# Patient Record
Sex: Female | Born: 1945 | Hispanic: Yes | Smoking: Never smoker
Health system: Southern US, Community
[De-identification: ages and names within clinical notes are randomized; demographics above are authoritative.]

## PROBLEM LIST (undated history)

## (undated) DIAGNOSIS — E119 Type 2 diabetes mellitus without complications: Secondary | ICD-10-CM

## (undated) DIAGNOSIS — I10 Essential (primary) hypertension: Secondary | ICD-10-CM

## (undated) DIAGNOSIS — E7800 Pure hypercholesterolemia, unspecified: Secondary | ICD-10-CM

## (undated) HISTORY — PX: ABDOMINAL HYSTERECTOMY: SHX81

## (undated) HISTORY — PX: CHOLECYSTECTOMY: SHX55

## (undated) HISTORY — DX: Essential (primary) hypertension: I10

## (undated) HISTORY — DX: Type 2 diabetes mellitus without complications: E11.9

---

## 2006-04-29 ENCOUNTER — Emergency Department (HOSPITAL_COMMUNITY): Admission: EM | Admit: 2006-04-29 | Discharge: 2006-04-29 | Payer: Self-pay | Admitting: Family Medicine

## 2010-01-22 ENCOUNTER — Emergency Department (HOSPITAL_COMMUNITY): Admission: EM | Admit: 2010-01-22 | Discharge: 2010-01-22 | Payer: Self-pay | Admitting: Family Medicine

## 2014-12-29 ENCOUNTER — Other Ambulatory Visit: Payer: Self-pay | Admitting: Family Medicine

## 2014-12-29 ENCOUNTER — Ambulatory Visit (INDEPENDENT_AMBULATORY_CARE_PROVIDER_SITE_OTHER): Payer: Self-pay | Admitting: Family Medicine

## 2014-12-29 VITALS — BP 134/78 | HR 81 | Temp 98.6°F | Resp 18 | Ht 60.0 in | Wt 149.0 lb

## 2014-12-29 DIAGNOSIS — M5432 Sciatica, left side: Secondary | ICD-10-CM

## 2014-12-29 DIAGNOSIS — H109 Unspecified conjunctivitis: Secondary | ICD-10-CM

## 2014-12-29 MED ORDER — TOBRAMYCIN 0.3 % OP SOLN: 1.0000 [drp] | OPHTHALMIC | Status: DC

## 2014-12-29 MED ORDER — PREDNISONE 20 MG PO TABS: ORAL_TABLET | ORAL | Status: DC

## 2014-12-29 NOTE — Patient Instructions (Signed)
If not better in 2 days, please let me know so we can try different medication.  If the high stay red, consider calling Dr. Ernesto Rutherford, an eye doctor who can check the pressure in the eye

## 2014-12-29 NOTE — Progress Notes (Signed)
Patient ID: Sara Harper, female   DOB: [birth year], 69 y.o.   MRN: [id number]   This chart was scribed for Elvina Sidle, MD by [facility], medical scribe at [facility].The patient was seen in exam room 13 and the patient's care was started at 4:56 PM.  Patient ID: Sara Harper MRN: [id number], DOB: 1946/05/10, 69 y.o. Date of Encounter: 12/29/2014  Primary Physician: No PCP Per Patient  Chief Complaint:  Chief Complaint  Patient presents with   sore on foot    left x2 yrs   Foot Pain    left    Ankle Pain    left   HPI:  Sara Harper is a 69 y.o. female with a history of diabetes any hypertension who presents to [facility] complaining of lower back pain that radiates down her left leg. This pain has been ongoing for two years. Pain is worse when she walks. She also complains of bilateral eye pain, redness, and irritation , she was seen by a doctor in [place] but she was not prescribed eye drops for this. She has taken OTC eye drops but these were not helpful. She denies abdominal pain, fevers and chills.   Past Medical History  Diagnosis Date   Diabetes mellitus without complication    Hypertension     Home Meds: Prior to Admission medications   Medication Sig Start Date End Date Taking? Authorizing Provider  enalapril (VASOTEC) 10 MG tablet Take 10 mg by mouth daily.   Yes Historical Provider, MD  metFORMIN (GLUCOPHAGE) 850 MG tablet Take 850 mg by mouth daily with breakfast.   Yes Historical Provider, MD   Allergies: No Known Allergies  History   Social History   Marital Status: Divorced    Spouse Name: N/A   Number of Children: N/A   Years of Education: N/A   Occupational History   Not on file.   Social History Main Topics   Smoking status: Never Smoker    Smokeless tobacco: Not on file   Alcohol Use: No   Drug Use: No   Sexual Activity: Not on file   Other Topics Concern   Not on file   Social  History Narrative   No narrative on file    Review of Systems: Constitutional: negative for chills, fever, night sweats, weight changes, or fatigue  HEENT: negative for vision changes, hearing loss, congestion, rhinorrhea, ST, epistaxis, or sinus pressure. Positive for eye pain Cardiovascular: negative for chest pain or palpitations Respiratory: negative for hemoptysis, wheezing, shortness of breath, or cough Abdominal: negative for abdominal pain, nausea, vomiting, diarrhea, or constipation Dermatological: negative for rash. Msk:  Neurologic: negative for headache, dizziness, or syncope All other systems reviewed and are otherwise negative with the exception to those above and in the HPI.  Physical Exam: Blood pressure 134/78, pulse 81, temperature 98.6 F (37 C), temperature source Oral, resp. rate 18, height 5' (1.524 m), weight 149 lb (67.586 kg), SpO2 96 %., Body mass index is 29.1 kg/(m^2). General: Well developed, well nourished, in no acute distress. Head: Normocephalic, atraumatic, nares are without discharge. Bilateral auditory canals clear, TM's are without perforation, pearly grey and translucent with reflective cone of light bilaterally. Oral cavity moist, posterior pharynx without exudate, erythema, peritonsillar abscess, or post nasal drip. Eyes are injected  Neck: Supple. No thyromegaly. Full ROM. No lymphadenopathy. Lungs: Clear bilaterally to auscultation without wheezes, rales, or rhonchi. Breathing is unlabored. Heart: RRR with S1 S2. No  murmurs, rubs, or gallops appreciated. Abdomen: Soft, non-tender, non-distended with normoactive bowel sounds. No hepatomegaly. No rebound/guarding. No obvious abdominal masses. Msk:  Strength and tone normal for age. Extremities/Skin: Warm and dry. No clubbing or cyanosis. No edema. No rashes or suspicious lesions. Mild swelling at the insertion of the left achillis tendon, no skin changes in either lower extremity, good pulses, good  rom. Neuro: Alert and oriented X 3. Moves all extremities spontaneously. Gait is normal. CNII-XII grossly in tact. Psych:  Responds to questions appropriately with a normal affect.     ASSESSMENT AND PLAN:  69 y.o. year old female with   This chart was scribed in my presence and reviewed by me personally.    ICD-9-CM ICD-10-CM   1. Sciatica, left 724.3 M54.32 predniSONE (DELTASONE) 20 MG tablet  2. Bilateral conjunctivitis 372.30 H10.9 tobramycin (TOBREX) 0.3 % ophthalmic solution     Signed, Elvina Sidle, MD  Signed, Elvina Sidle, MD 12/29/2014 4:56 PM

## 2014-12-30 ENCOUNTER — Other Ambulatory Visit: Payer: Self-pay | Admitting: Family Medicine

## 2015-02-07 ENCOUNTER — Ambulatory Visit (INDEPENDENT_AMBULATORY_CARE_PROVIDER_SITE_OTHER): Payer: Self-pay | Admitting: Emergency Medicine

## 2015-02-07 VITALS — BP 122/72 | HR 73 | Temp 98.4°F | Resp 16 | Ht 60.0 in | Wt 151.0 lb

## 2015-02-07 DIAGNOSIS — E119 Type 2 diabetes mellitus without complications: Secondary | ICD-10-CM

## 2015-02-07 DIAGNOSIS — M5432 Sciatica, left side: Secondary | ICD-10-CM

## 2015-02-07 LAB — POCT GLYCOSYLATED HEMOGLOBIN (HGB A1C): Hemoglobin A1C: 7.3

## 2015-02-07 MED ORDER — NAPROXEN SODIUM 550 MG PO TABS: 550.0000 mg | ORAL_TABLET | Freq: Two times a day (BID) | ORAL | Status: AC

## 2015-02-07 MED ORDER — CYCLOBENZAPRINE HCL 10 MG PO TABS: 10.0000 mg | ORAL_TABLET | Freq: Three times a day (TID) | ORAL | Status: DC | PRN

## 2015-02-07 NOTE — Patient Instructions (Signed)
Citica  (Sciatica)  La citica es el dolor, debilidad, entumecimiento u hormigueo a lo largo del nervio citico. El nervio comienza en la zona inferior de la espalda y desciende por la parte posterior de cada pierna. El nervio controla los msculos de la parte inferior de la pierna y de la zona posterior de la rodilla, y transmite la sensibilidad a la parte posterior del muslo, la pierna y la planta del pie. La citica es un sntoma de otras afecciones mdicas. Por ejemplo, un dao a los nervios o algunas enfermedades como un disco herniado o un espoln seo en la columna vertebral, podran daarle o presionar en el nervio citico. Esto causa dolor, debilidad y otras sensaciones normalmente asociadas con la citica. Generalmente la citica afecta slo un lado del cuerpo. CAUSAS   Disco herniado o desplazado.  Enfermedad degenerativa del disco.  Un sndrome doloroso que compromete un msculo angosto de los glteos (sndrome piriforme).  Lesin o fractura plvica.  Embarazo.  Tumor (casos raros). SNTOMAS  Los sntomas pueden variar de leves a muy graves. Por lo general, los sntomas descienden desde la zona lumbar a las nalgas y la parte posterior de la pierna. Ellos son:   Hormigueo leve o dolor sordo en la parte inferior de la espalda, la pierna o la cadera.  Adormecimiento en la parte posterior de la pantorrilla o la planta del pie.  Sensacin de quemazn en la zona lumbar, la pierna o la cadera.  Dolor agudo en la zona inferior de la espalda, la pierna o la cadera.  Debilidad en las piernas.  Dolor de espalda intenso que inhibe los movimientos. Los sntomas pueden empeorar al toser, estornudar, rer o estar sentado o parado durante mucho tiempo. Adems, el sobrepeso puede empeorar los sntomas.  DIAGNSTICO  Su mdico le har un examen fsico para buscar los sntomas comunes de la citica. Le pedir que haga algunos movimientos o actividades que activaran el dolor del nervio  citico. Para encontrar las causas de la citica podr indicarle otros estudios. Estos pueden ser:   Anlisis de sangre.  Radiografas.  Pruebas de diagnstico por imgenes, como resonancia magntica o tomografa computada. TRATAMIENTO  El tratamiento se dirige a las causas de la citica. A veces, el tratamiento no es necesario, y el dolor y el malestar desaparecen por s mismos. Si necesita tratamiento, su mdico puede sugerir:   Medicamentos de venta libre para aliviar el dolor.  Medicamentos recetados, como antiinflamatorios, relajantes musculares o narcticos.  Aplicacin de calor o hielo en la zona del dolor.  Inyecciones de corticoides para disminuir el dolor, la irritacin y la inflamacin alrededor del nervio.  Reduccin de la actividad en los perodos de dolor.  Ejercicios y estiramiento del abdomen para fortalecer y mejorar la flexibilidad de la columna vertebral. Su mdico puede sugerirle perder peso si el peso extra empeora el dolor de espalda.  Fisioterapia.  La ciruga para eliminar lo que presiona o pincha el nervio, como un espoln seo o parte de una hernia de disco. INSTRUCCIONES PARA EL CUIDADO EN EL HOGAR   Slo tome medicamentos de venta libre o recetados para calmar el dolor o el malestar, segn las indicaciones de su mdico.  Aplique hielo sobre el rea dolorida durante 20 minutos 3-4 veces por da durante los primeras 48-72 horas. Luego intente aplicar calor de la misma manera.  Haga ejercicios, elongue o realice sus actividades habituales, si no le causan ms dolor.  Cumpla con todas las sesiones de fisioterapia, segn le   indique su mdico.  Cumpla con todas las visitas de control, segn le indique su mdico.  No use tacones altos o zapatos que no tengan buen apoyo.  Verifique que el colchn no sea muy blando. Un colchn firme aliviar el dolor y las molestias. SOLICITE ATENCIN MDICA DE INMEDIATO SI:   Pierde el control de la vejiga o del intestino  (incontinencia).  Aumenta la debilidad en la zona inferior de la espalda, la pelvis, las nalgas o las piernas.  Siente irritacin o inflamacin en la espalda.  Tiene sensacin de ardor al orinar.  El dolor empeora cuando se acuesta o lo despierta por la noche.  El dolor es peor del que experiment en el pasado.  Dura ms de 4 semanas.  Pierde peso sin motivo de manera sbita. ASEGRESE DE QUE:   Comprende estas instrucciones.  Controlar su enfermedad.  Solicitar ayuda de inmediato si no mejora o si empeora. Document Released: 07/07/2005 Document Revised: 01/06/2012 ExitCare Patient Information 2015 [employer]. This information is not intended to replace advice given to you by your health care provider. Make sure you discuss any questions you have with your health care provider.  

## 2015-02-07 NOTE — Progress Notes (Signed)
Subjective:  Patient ID: Sara Harper, female    DOB: 12-07-1945  Age: 69 y.o. MRN: [id number]  CC: Follow-up; Hip Pain; Medication Refill; and Shoulder Pain   HPI Sara Harper presents  questioning a refill on her prednisone for sciatic neuritis. Diabetic [place]. [place] she been here for 5 months and is to be her another month before she returns home. She doesn't check her sugar. She has no numbness or tingling in her leg. Has radiation of pain from the left hip or left knee. She does have some weakness in her knee. "Give way" pain is not severe. It is more annoying and dull History Sara Harper has a past medical history of Diabetes mellitus without complication and Hypertension.   She has past surgical history that includes Abdominal hysterectomy and Cholecystectomy.   Her  family history is not on file.  She   reports that she has never smoked. She does not have any smokeless tobacco history on file. She reports that she does not drink alcohol or use illicit drugs.  Outpatient Prescriptions Prior to Visit  Medication Sig Dispense Refill  . enalapril (VASOTEC) 10 MG tablet Take 10 mg by mouth daily.    . metFORMIN (GLUCOPHAGE) 850 MG tablet Take 850 mg by mouth daily with breakfast.    . predniSONE (DELTASONE) 20 MG tablet TAKE TWO TABLETS BY MOUTH DAILY WITH FOOD 10 tablet 0  . tobramycin (TOBREX) 0.3 % ophthalmic solution Place 1 drop into both eyes every 4 (four) hours. 5 mL 0   No facility-administered medications prior to visit.    History   Social History  . Marital Status: Divorced    Spouse Name: N/A  . Number of Children: N/A  . Years of Education: N/A   Social History Main Topics  . Smoking status: Never Smoker   . Smokeless tobacco: Not on file  . Alcohol Use: No  . Drug Use: No  . Sexual Activity: Not on file   Other Topics Concern  . None   Social History Narrative     Review of Systems  Constitutional: Negative for fever, chills  and appetite change.  HENT: Negative for congestion, ear pain, postnasal drip, sinus pressure and sore throat.   Eyes: Negative for pain and redness.  Respiratory: Negative for cough, shortness of breath and wheezing.   Cardiovascular: Negative for leg swelling.  Gastrointestinal: Negative for nausea, vomiting, abdominal pain, diarrhea, constipation and blood in stool.  Endocrine: Negative for polyuria.  Genitourinary: Negative for dysuria, urgency, frequency and flank pain.  Musculoskeletal: Negative for gait problem.  Skin: Negative for rash.  Neurological: Negative for weakness and headaches.  Psychiatric/Behavioral: Negative for confusion and decreased concentration. The patient is not nervous/anxious.     Objective:  BP 122/72 mmHg  Pulse 73  Temp(Src) 98.4 F (36.9 C) (Oral)  Resp 16  Ht 5' (1.524 m)  Wt 151 lb (68.493 kg)  BMI 29.49 kg/m2  SpO2 98%  Physical Exam  Constitutional: She is oriented to person, place, and time. She appears well-developed and well-nourished. No distress.  HENT:  Head: Normocephalic and atraumatic.  Right Ear: External ear normal.  Left Ear: External ear normal.  Nose: Nose normal.  Eyes: Conjunctivae and EOM are normal. Pupils are equal, round, and reactive to light. No scleral icterus.  Neck: Normal range of motion. Neck supple. No tracheal deviation present.  Cardiovascular: Normal rate, regular rhythm and normal heart sounds.   Pulmonary/Chest: Effort normal. No respiratory  distress. She has no wheezes. She has no rales.  Abdominal: She exhibits no mass. There is no tenderness. There is no rebound and no guarding.  Musculoskeletal: She exhibits no edema.  Lymphadenopathy:    She has no cervical adenopathy.  Neurological: She is alert and oriented to person, place, and time. Coordination normal.  Skin: Skin is warm and dry. No rash noted.  Psychiatric: She has a normal mood and affect. Her behavior is normal.      Assessment & Plan:     Od[place] was seen today for follow-up, hip pain, medication refill and shoulder pain.  Diagnoses and all orders for this visit:  Sciatica, left  Diabetes mellitus without complication Orders: -     POCT glycosylated hemoglobin (Hb A1C)  Other orders -     naproxen sodium (ANAPROX DS) 550 MG tablet; Take 1 tablet (550 mg total) by mouth 2 (two) times daily with a meal. -     cyclobenzaprine (FLEXERIL) 10 MG tablet; Take 1 tablet (10 mg total) by mouth 3 (three) times daily as needed for muscle spasms.   I am having Ms. Luse start on naproxen sodium and cyclobenzaprine. I am also having her maintain her metFORMIN, enalapril, tobramycin, and predniSONE.  Meds ordered this encounter  Medications  . naproxen sodium (ANAPROX DS) 550 MG tablet    Sig: Take 1 tablet (550 mg total) by mouth 2 (two) times daily with a meal.    Dispense:  60 tablet    Refill:  1  . cyclobenzaprine (FLEXERIL) 10 MG tablet    Sig: Take 1 tablet (10 mg total) by mouth 3 (three) times daily as needed for muscle spasms.    Dispense:  45 tablet    Refill:  1    Appropriate red flag conditions were discussed with the patient as well as actions that should be taken.  Patient expressed his understanding.  Follow-up: Return if symptoms worsen or fail to improve.  Carmelina Dane, MD   Results for orders placed or performed in visit on 02/07/15  POCT glycosylated hemoglobin (Hb A1C)  Result Value Ref Range   Hemoglobin A1C 7.3

## 2015-02-16 ENCOUNTER — Telehealth: Payer: Self-pay

## 2015-02-16 NOTE — Telephone Encounter (Signed)
[facility] pharmacy is requesting a refill of enalapril for patient.    [phone]

## 2015-02-17 ENCOUNTER — Other Ambulatory Visit: Payer: Self-pay | Admitting: *Deleted

## 2015-02-17 DIAGNOSIS — I10 Essential (primary) hypertension: Secondary | ICD-10-CM

## 2015-02-17 MED ORDER — ENALAPRIL MALEATE 10 MG PO TABS: 10.0000 mg | ORAL_TABLET | Freq: Every day | ORAL | Status: DC

## 2015-02-19 NOTE — Telephone Encounter (Signed)
This was refilled on 7/30

## 2017-03-16 ENCOUNTER — Encounter (HOSPITAL_COMMUNITY): Payer: Self-pay | Admitting: Emergency Medicine

## 2017-03-16 ENCOUNTER — Ambulatory Visit (HOSPITAL_COMMUNITY)
Admission: EM | Admit: 2017-03-16 | Discharge: 2017-03-16 | Disposition: A | Discharge status: Home / Self Care | Payer: Self-pay | Attending: Emergency Medicine | Admitting: Emergency Medicine

## 2017-03-16 DIAGNOSIS — M79605 Pain in left leg: Secondary | ICD-10-CM

## 2017-03-16 DIAGNOSIS — G8929 Other chronic pain: Secondary | ICD-10-CM

## 2017-03-16 DIAGNOSIS — M5432 Sciatica, left side: Secondary | ICD-10-CM

## 2017-03-16 DIAGNOSIS — M5442 Lumbago with sciatica, left side: Secondary | ICD-10-CM

## 2017-03-16 DIAGNOSIS — E119 Type 2 diabetes mellitus without complications: Secondary | ICD-10-CM

## 2017-03-16 MED ORDER — HYDROCODONE-ACETAMINOPHEN 5-325 MG PO TABS: 1.0000 | ORAL_TABLET | ORAL | 0 refills | Status: DC | PRN

## 2017-03-16 NOTE — ED Notes (Signed)
Spoke in detail with patient and daughter about importance of following up with community health and wellness

## 2017-03-16 NOTE — Discharge Instructions (Signed)
Take medication as directed. Unable to offer many tablets of controlled medication. He will have to follow-up with the primary care provider. He will also need a doctor to follow-up with your hypertension and diabetes.

## 2017-03-16 NOTE — ED Triage Notes (Signed)
PT reports lower back pain, left hip pain, left knee pain, and left calf pain. PT is visiting from [place] and her medicine is not working. PT's left knee is visibly swollen.

## 2017-03-16 NOTE — ED Provider Notes (Signed)
[facility]    CSN: [id number] Arrival date & time: 03/16/17  1649     History   Chief Complaint Chief Complaint  Patient presents with  . Leg Pain  . Back Pain    HPI Sara Harper is a 71 y.o. female.   71 year old female from [place] is accompanied by her significant other English-speaking female who is her interpreter. She has a history of sciatica and left low back pain for at least 2 years. She is moving to this area and she has been to the urgent care and other practices for pain medication for this. Gels has a history of diabetes and hypertension. Initially the plan was for her to stay until this month and then go back home but according to the significant other she is now staying. Her complaint is that of increased pain with her sciatica pain in the left low back radiating the left thigh, knee to the calf. The patient has a list of medications written in [place]. This includes Neurontin, Ultracet, vitamin injection and a couple of other medications. She states none of these medicines are helping with pain.      Past Medical History:  Diagnosis Date  . Diabetes mellitus without complication (HCC)   . Hypertension     There are no active problems to display for this patient.   Past Surgical History:  Procedure Laterality Date  . ABDOMINAL HYSTERECTOMY    . CHOLECYSTECTOMY      OB History    No data available       Home Medications    Prior to Admission medications   Medication Sig Start Date End Date Taking? Authorizing Provider  cyclobenzaprine (FLEXERIL) 10 MG tablet Take 1 tablet (10 mg total) by mouth 3 (three) times daily as needed for muscle spasms. 02/07/15   Carmelina Dane, MD  enalapril (VASOTEC) 10 MG tablet Take 1 tablet (10 mg total) by mouth daily. 02/17/15   Brewington, Tishira R, PA-C  HYDROcodone-acetaminophen (NORCO/VICODIN) 5-325 MG tablet Take 1 tablet by mouth every 4 (four) hours as needed. 03/16/17   Hayden Rasmussen, NP    metFORMIN (GLUCOPHAGE) 850 MG tablet Take 850 mg by mouth daily with breakfast.    [provider]  predniSONE (DELTASONE) 20 MG tablet TAKE TWO TABLETS BY MOUTH DAILY WITH FOOD 01/01/15   Elvina Sidle, MD  tobramycin (TOBREX) 0.3 % ophthalmic solution Place 1 drop into both eyes every 4 (four) hours. 12/29/14   Elvina Sidle, MD    Family History No family history on file.  Social History Social History  Substance Use Topics  . Smoking status: Never Smoker  . Smokeless tobacco: Not on file  . Alcohol use No     Allergies   Patient has no known allergies.   Review of Systems Review of Systems  Constitutional: Negative.  Negative for activity change, chills and fever.  HENT: Negative.   Respiratory: Negative.   Cardiovascular: Negative.   Gastrointestinal:       Epigastric discomfort associated with taking medications.  Musculoskeletal: Positive for back pain.       As per HPI  Skin: Negative for color change, pallor and rash.  Neurological: Negative.   All other systems reviewed and are negative.    Physical Exam Triage Vital Signs ED Triage Vitals  Enc Vitals Group     BP 03/16/17 1716 137/75     Pulse Rate 03/16/17 1716 77     Resp 03/16/17 1716 16  Temp 03/16/17 1716 98.1 F (36.7 C)     Temp Source 03/16/17 1716 Temporal     SpO2 03/16/17 1716 96 %     Weight 03/16/17 1714 150 lb (68 kg)     Height --      Head Circumference --      Peak Flow --      Pain Score 03/16/17 1715 8     Pain Loc --      Pain Edu? --      Excl. in GC? --    No data found.   Updated Vital Signs BP 137/75   Pulse 77   Temp 98.1 F (36.7 C) (Temporal)   Resp 16   Wt 150 lb (68 kg)   SpO2 96%   BMI 29.29 kg/m   Visual Acuity Right Eye Distance:   Left Eye Distance:   Bilateral Distance:    Right Eye Near:   Left Eye Near:    Bilateral Near:     Physical Exam  Constitutional: She is oriented to person, place, and time. She appears  well-developed and well-nourished. No distress.  HENT:  Head: Normocephalic and atraumatic.  Eyes: EOM are normal.  Neck: Neck supple.  Pulmonary/Chest: Effort normal.  Musculoskeletal: She exhibits no edema, tenderness or deformity.  Tenderness to the left lower back upper hip musculature. Patient is able to flex and extend the left knee. There is intermittent popping and cracking of the left knee likely secondary to arthritis.  Neurological: She is alert and oriented to person, place, and time. No cranial nerve deficit.  Skin: Skin is warm and dry.  Psychiatric: She has a normal mood and affect.  Nursing note and vitals reviewed.    UC Treatments / Results  Labs (all labs ordered are listed, but only abnormal results are displayed) Labs Reviewed - No data to display  EKG  EKG Interpretation None       Radiology No results found.  Procedures Procedures (including critical care time)  Medications Ordered in UC Medications - No data to display   Initial Impression / Assessment and Plan / UC Course  I have reviewed the triage vital signs and the nursing notes.  Pertinent labs & imaging results that were available during my care of the patient were reviewed by me and considered in my medical decision making (see chart for details).    Take medication as directed. Unable to offer many tablets of controlled medication. He will have to follow-up with the primary care provider. He will also need a doctor to follow-up with your hypertension and diabetes.    Final Clinical Impressions(s) / UC Diagnoses   Final diagnoses:  Sciatica of left side  Left leg pain  Chronic left-sided low back pain with left-sided sciatica  Type 2 diabetes mellitus without complication, without long-term current use of insulin (HCC)    New Prescriptions New Prescriptions   HYDROCODONE-ACETAMINOPHEN (NORCO/VICODIN) 5-325 MG TABLET    Take 1 tablet by mouth every 4 (four) hours as needed.      Controlled Substance Prescriptions [place] Controlled Substance Registry consulted? Yes, I have consulted the [place] Controlled Substances Registry for this patient, and feel the risk/benefit ratio today is favorable for proceeding with this prescription for a controlled substance.   Hayden Rasmussen, NP 03/16/17 1820

## 2017-04-07 ENCOUNTER — Ambulatory Visit (HOSPITAL_COMMUNITY)
Admission: EM | Admit: 2017-04-07 | Discharge: 2017-04-07 | Disposition: A | Discharge status: Home / Self Care | Payer: Self-pay | Attending: Family Medicine | Admitting: Family Medicine

## 2017-04-07 ENCOUNTER — Encounter (HOSPITAL_COMMUNITY): Payer: Self-pay | Admitting: Emergency Medicine

## 2017-04-07 DIAGNOSIS — M5432 Sciatica, left side: Secondary | ICD-10-CM

## 2017-04-07 MED ORDER — PREDNISONE 10 MG (21) PO TBPK: ORAL_TABLET | Freq: Every day | ORAL | 0 refills | Status: DC

## 2017-04-07 NOTE — ED Triage Notes (Signed)
PT was seen here 3 weeks ago for leg pain. PT was prescribed vicodin. PT requests refill.  PT reports right hand itching and she cannot use it as well ("limp")

## 2017-04-08 NOTE — ED Provider Notes (Signed)
  [facility]   [id number] 04/07/17 Arrival Time: 1746  ASSESSMENT & PLAN:  1. Sciatica of left side     Meds ordered this encounter  Medications  . predniSONE (STERAPRED UNI-PAK 21 TAB) 10 MG (21) TBPK tablet    Sig: Take by mouth daily. Take as directed.    Dispense:  21 tablet    Refill:  0   May use OTC ibuprofen in addition. Recommend orthopaedic f/u if not improving. Reviewed expectations re: course of current medical issues. Questions answered. Outlined signs and symptoms indicating need for more acute intervention. Patient verbalized understanding. After Visit Summary given.   SUBJECTIVE:  Sara Harper is a 71 y.o. female who presents for f/u concerning L leg and back pain. Last note reviewed. Overall she reports improvement. Just slight symptoms occasionally that sometimes affect her ADLs. No new symptoms. Requests refill of Vicodin.  ROS: As per HPI.   OBJECTIVE:  Vitals:   04/07/17 1816 04/07/17 1818  BP:  113/79  Pulse:  70  Resp:  16  Temp:  98.8 F (37.1 C)  TempSrc:  Oral  SpO2:  97%  Weight: 150 lb (68 kg)     General appearance: alert; no distress Back: mild tenderness reported over lower L musculature; FROM Extremities: no cyanosis or edema; symmetrical with no gross deformities; FROM of lower extremities; normal strength Skin: warm and dry Neurologic: normal gait; normal symmetric reflexes Psychological: alert and cooperative; normal mood and affect   No Known Allergies  Past Medical History:  Diagnosis Date  . Diabetes mellitus without complication (HCC)   . Hypertension    Social History   Social History  . Marital status: Divorced    Spouse name: N/A  . Number of children: N/A  . Years of education: N/A   Occupational History  . Not on file.   Social History Main Topics  . Smoking status: Never Smoker  . Smokeless tobacco: Not on file  . Alcohol use No  . Drug use: No  . Sexual activity: Not on file   Other  Topics Concern  . Not on file   Social History Narrative  . No narrative on file   No family history on file. Past Surgical History:  Procedure Laterality Date  . ABDOMINAL HYSTERECTOMY    . Barrington Ellison, MD 04/08/17 1150

## 2017-04-20 ENCOUNTER — Encounter: Payer: Self-pay | Admitting: Family Medicine

## 2017-04-20 ENCOUNTER — Ambulatory Visit (INDEPENDENT_AMBULATORY_CARE_PROVIDER_SITE_OTHER): Payer: Self-pay | Admitting: Family Medicine

## 2017-04-20 VITALS — BP 137/77 | HR 84 | Temp 99.1°F | Resp 14 | Ht 59.0 in | Wt 148.0 lb

## 2017-04-20 DIAGNOSIS — E119 Type 2 diabetes mellitus without complications: Secondary | ICD-10-CM

## 2017-04-20 DIAGNOSIS — M5432 Sciatica, left side: Secondary | ICD-10-CM

## 2017-04-20 DIAGNOSIS — R81 Glycosuria: Secondary | ICD-10-CM

## 2017-04-20 DIAGNOSIS — I10 Essential (primary) hypertension: Secondary | ICD-10-CM

## 2017-04-20 DIAGNOSIS — Z789 Other specified health status: Secondary | ICD-10-CM

## 2017-04-20 DIAGNOSIS — Z23 Encounter for immunization: Secondary | ICD-10-CM

## 2017-04-20 DIAGNOSIS — M1990 Unspecified osteoarthritis, unspecified site: Secondary | ICD-10-CM

## 2017-04-20 LAB — POCT URINALYSIS DIP (DEVICE)
Bilirubin Urine: NEGATIVE
GLUCOSE, UA: 500 mg/dL — AB
KETONES UR: NEGATIVE mg/dL
Leukocytes, UA: NEGATIVE
Nitrite: NEGATIVE
PROTEIN: NEGATIVE mg/dL
SPECIFIC GRAVITY, URINE: 1.015 (ref 1.005–1.030)
UROBILINOGEN UA: 0.2 mg/dL (ref 0.0–1.0)
pH: 5.5 (ref 5.0–8.0)

## 2017-04-20 LAB — POCT GLYCOSYLATED HEMOGLOBIN (HGB A1C): HEMOGLOBIN A1C: 7.6

## 2017-04-20 MED ORDER — ENALAPRIL MALEATE 10 MG PO TABS: 10.0000 mg | ORAL_TABLET | Freq: Every day | ORAL | 1 refills | Status: DC

## 2017-04-20 MED ORDER — DICLOFENAC SODIUM 75 MG PO TBEC: 75.0000 mg | DELAYED_RELEASE_TABLET | Freq: Two times a day (BID) | ORAL | 1 refills | Status: DC

## 2017-04-20 MED ORDER — KETOROLAC TROMETHAMINE 60 MG/2ML IM SOLN
30.0000 mg | Freq: Once | INTRAMUSCULAR | Status: AC
  Administered 2017-04-20: 30 mg via INTRAMUSCULAR

## 2017-04-20 NOTE — Progress Notes (Signed)
Subjective:    Sara Harper is a 71 y.o. female with a history of type 2 diabetes mellitus and hypertension presents accompanied by daughter complaining of left hip pain over the past year. Patient primarily speaks spanish, her daughter is interpreting. Patient is visiting daughter from [place]. She was evaluated at [facility] on 04/07/2017 for sciatica on left side. She was prescribed Prednisone per tapered dose with moderate relief. She says that pain restarted 1 week ago. Pain is described as intermittent and sharp. The patient reports the hip pain is worse with weight bearing and is aggravated by walking. Aggravating symptoms include: going up and down stairs, inactivity, kneeling and lateral movements. Patient has had prior hip problems. She has not attempted OTC interventions to alleviate symptoms.   Patient has a history of type 2 diabetes mellitus. She has been taking Metformin 850 mg daily. She does not follow a carbohydrate modified diet.   Patient denies foot ulcerations, nausea, paresthesia of the feet, visual disturbances, vomitting and weight loss.   Past Medical History:  Diagnosis Date  . Diabetes mellitus without complication (HCC)   . Hypertension    Social History   Social History  . Marital status: Divorced    Spouse name: N/A  . Number of children: N/A  . Years of education: N/A   Occupational History  . Not on file.   Social History Main Topics  . Smoking status: Never Smoker  . Smokeless tobacco: Never Used  . Alcohol use No  . Drug use: No  . Sexual activity: Not on file   Other Topics Concern  . Not on file   Social History Narrative  . No narrative on file   Immunization History  Administered Date(s) Administered  . Influenza,inj,Quad PF,6+ Mos 04/20/2017   Review of Systems  Constitutional: Negative.   HENT: Negative.   Eyes: Negative.   Respiratory: Negative.   Cardiovascular: Negative.   Gastrointestinal: Negative.   Genitourinary:  Negative.   Musculoskeletal: Positive for joint pain (left hip pain).  Skin: Negative.   Neurological: Negative.   Endo/Heme/Allergies: Negative.   Psychiatric/Behavioral: Positive for memory loss.    Objective:    BP 137/77 (BP Location: Left Arm, Patient Position: Sitting, Cuff Size: Normal)   Pulse 84   Temp 99.1 F (37.3 C) (Oral)   Resp 14   Ht  (1.499 m)   Wt 148 lb (67.1 kg)   SpO2 100%   BMI 29.89 kg/m  Physical Exam  Constitutional: She is oriented to person, place, and time and well-developed, well-nourished, and in no distress.  HENT:  Head: Normocephalic and atraumatic.  Right Ear: External ear normal.  Left Ear: External ear normal.  Nose: Nose normal.  Mouth/Throat: Oropharynx is clear and moist.  Eyes: Pupils are equal, round, and reactive to light. Conjunctivae and EOM are normal.  Neck: Normal range of motion. Neck supple.  Cardiovascular: Normal rate, regular rhythm, normal heart sounds and intact distal pulses.   Pulmonary/Chest: Effort normal and breath sounds normal.  Abdominal: Soft. Bowel sounds are normal.  Neurological: She is alert and oriented to person, place, and time. Gait normal.  Skin: Skin is warm and dry.  Psychiatric: Mood, memory, affect and judgment normal.   Assessment:  BP 137/77 (BP Location: Left Arm, Yaeli Hartungosition: Sitting, Cuff Size: Normal)   Pulse 84   Temp 99.1 F (37.3 C) (Oral)   Resp 14   Ht  (1.499 m)   Wt 148 lb (  67.1 kg)   SpO2 100%   BMI 29.89 kg/m   Plan:  1. Type 2 diabetes mellitus without complication, without long-term current use of insulin (HCC) Hemoglobin a1c is 7.6.  Will continue Metformin 850 mg daily  Your A1C goal is less than 7. Your fasting blood sugar  Upon awakening goal is between 110-140.  Your LDL  (bad cholesterol goal is less than 100 Blood pressure goal is <140/90.  Recommend a lowfat, low carbohydrate diet divided over 5-6 small meals, increase water intake to 6-8  glasses, and 150 minutes per week of cardiovascular exercise.   Take your medications as prescribed Make sure that you are familiar with each one of your medications and what they are used to treat.  If you are unsure of medications, please bring to follow up Will send referral for eye exam  Please keep your scheduled follow up appointment.   - HgB A1c - BASIC METABOLIC PANEL WITH GFR  2. Arthritis Will start a trial of Voltaren 75 mg BID - ANA - Sedimentation Rate - Rheumatoid factor - Uric acid - diclofenac (VOLTAREN) 75 MG EC tablet; Take 1 tablet (75 mg total) by mouth 2 (two) times daily.  Dispense: 60 tablet; Refill: 1 - keEducational materials distributed. NSAIDs per medication orders. Lab evaluation per orders.torolac (TORADOL) injection 30 mg; Inject 1 mL (30 mg total) into the muscle once.  3. Essential hypertension Blood pressure is at goal on current medication regimen.  No medication changes warranted - enalapril (VASOTEC) 10 MG tablet; Take 1 tablet (10 mg total) by mouth daily.  Dispense: 30 tablet; Refill: 1 - BASIC METABOLIC PANEL WITH GFR  4. Influenza vaccination given  - Flu Vaccine QUAD 6+ mos PF IM (Fluarix Quad PF)  5. Glucosuria Reviewed medications, moderate glucose in urine Continue Metformin 850 mg daily   6. Language barrier to communication Primarily speaks spanish, daughter interpreting  RTC: 1 month for left hip pain    Nolon Nations  MSN, FNP-C [facility] [address] [place], [place] [address] [phone]

## 2017-04-20 NOTE — Patient Instructions (Addendum)
Llame en 1 semana si el dolor de la artritis empeora Seguir por telfono con cualquier resultado anormal de laboratorio  Su meta A1C es menor que 7. Su azcar en la sangre en ayunas al despertar meta es entre 110-140.  Su LDL (meta del colesterol malo es menos que 100 El objetivo de la presin sangunea es < 140/90.  Recomiende una dieta baja en carbohidratos bajo en grasa, dividida sobre 5-6 pequeas comidas, aumente la ingesta de agua a 6-8 vasos, y 150 minutos por semana de ejercicio cardiovascular.   Tome sus medicamentos como se prescriben Asegrese de que est familiarizado con cada uno de sus medicamentos y lo que estn acostumbrados a Warehouse manager.  Si usted no est seguro de los medicamentos, por favor, lleve a Systems analyst referencia para el examen de la vista por favor mantenga su cita de seguimiento programada.    Artritis (Arthritis) El trmino artritis se [place] comnmente para hacer referencia al dolor de las articulaciones o a la enfermedad articular. Hay ms de 100tipos de artritis. CAUSAS La causa ms frecuente de esta afeccin es el desgaste de una articulacin. Algunas otras causas son las siguientes:  Gota.  Inflamacin de una articulacin.  Una infeccin de Risk analyst.  Esguinces y otras lesiones cerca de la articulacin.  Una reaccin farmacolgica o alrgica. En algunos casos, es posible que la causa no se conozca. SNTOMAS El sntoma principal de esta afeccin es el dolor de la articulacin con el movimiento. Otros sntomas pueden ser los siguientes:  Enrojecimiento, hinchazn o rigidez de Risk analyst.  Calor que emana de Nurse, learning disability.  Grant Ruts.  Sensacin generalizada de estar enfermo. DIAGNSTICO Esta afeccin se puede diagnosticar mediante un examen fsico y Judy Pimple ellos:  Anlisis de [place].  Anlisis de [place].  Estudios de diagnstico por imgenes, como una resonancia magntica (RM), radiografas o una tomografa  computarizada (TC). A veces, se extrae lquido de una articulacin para analizarlo. TRATAMIENTO El tratamiento de esta afeccin puede incluir lo siguiente:  El tratamiento de la causa, si se conoce.  Reposo.  Mantener elevada la articulacin.  Aplicar compresas fras o calientes en la articulacin.  Medicamentos para [employer] sntomas y Social research officer, government.  Inyecciones de un corticoide, como cortisona, en la articulacin para ayudar a Engineer, materials y Social research officer, government. Segn la causa de la artritis, tal vez haya que hacer cambios en el estilo de vida para reducir la carga sobre la articulacin. Algunos de los cambios incluyen realizar ms actividad fsica y Publishing copy de Edmund, Lucas. INSTRUCCIONES PARA EL CUIDADO EN EL HOGAR Medicamentos  [employer] de venta libre y los recetados solamente como se lo haya indicado el mdico.  No tome aspirina para Acupuncturist dolor si se sospecha la presencia de [place]. Actividades  Ponga en reposo la articulacin como se lo haya indicado el mdico. El reposo es importante cuando la enfermedad est activa y la articulacin le duele, est hinchada o rgida.  Evite las actividades que intensifiquen Chief Technology Officer. Es importante Contractor equilibrio entre la actividad y el reposo.  Con frecuencia, realice ejercicios de flexibilidad articular como se lo haya indicado el mdico. Intente realizar ejercicios de bajo impacto, por ejemplo: ? Natacin. ? Gimnasia acutica. ? Andar en bicicleta. ? Caminar. Cuidado de la articulacin  Si la articulacin se le hincha, mantngala elevada como se lo haya indicado el mdico.  Si al despertar por la maana, nota que la articulacin est rgida, intente tomar [place]  con agua tibia.  Si se lo indican, pngase calor en la articulacin. Si es diabtico, no se aplique calor sin la autorizacin del mdico. ? Coloque una toalla entre la articulacin y la compresa caliente o la almohadilla  trmica. ? Coloque el calor en la zona durante 20 o .  Si se lo indican, pngase hielo en la articulacin: ? Ponga el hielo en una bolsa plstica. ? Coloque una [employer] piel y la bolsa de hielo. ? Coloque el hielo durante , 2 a 3veces por da.  Concurra a todas las visitas de control como se lo haya indicado el mdico. Esto es importante. SOLICITE ATENCIN MDICA SI:  El dolor empeora.  Tiene fiebre. SOLICITE ATENCIN MDICA DE INMEDIATO SI:  Siente dolor, u observa hinchazn o enrojecimiento en la articulacin.  Siente dolor en muchas articulaciones y se le hinchan.  Siente un dolor intenso en la espalda.  Tiene mucha debilidad en la pierna.  No puede controlar los intestinos o la vejiga. Esta informacin no tiene Theme park manager el consejo del mdico. Asegrese de hacerle al mdico cualquier pregunta que tenga. Document Released: 07/07/2005 Document Revised: 10/29/2015 Document Reviewed: 10/02/2014 Elsevier Interactive Patient Education  [employer].

## 2017-04-21 LAB — URIC ACID: Uric Acid, Serum: 5.7 mg/dL (ref 2.5–7.0)

## 2017-04-21 LAB — BASIC METABOLIC PANEL WITH GFR
BUN: 17 mg/dL (ref 7–25)
CALCIUM: 9.6 mg/dL (ref 8.6–10.4)
CHLORIDE: 98 mmol/L (ref 98–110)
CO2: 25 mmol/L (ref 20–32)
CREATININE: 0.73 mg/dL (ref 0.50–0.99)
GFR, EST AFRICAN AMERICAN: 98 mL/min/{1.73_m2} (ref >=60)
GFR, EST NON AFRICAN AMERICAN: 85 mL/min/{1.73_m2} (ref >=60)
Glucose, Bld: 197 mg/dL — ABNORMAL HIGH (ref 65–99)
Potassium: 4.8 mmol/L (ref 3.5–5.3)
Sodium: 133 mmol/L — ABNORMAL LOW (ref 135–146)

## 2017-04-21 LAB — RHEUMATOID FACTOR: Rhuematoid fact SerPl-aCnc: <14 IU/mL (ref <14)

## 2017-04-21 LAB — SEDIMENTATION RATE: Sed Rate: 9 mm/h (ref 0–30)

## 2017-04-21 LAB — ANA: ANA: NEGATIVE

## 2017-04-22 DIAGNOSIS — Z789 Other specified health status: Secondary | ICD-10-CM | POA: Insufficient documentation

## 2017-04-22 DIAGNOSIS — I10 Essential (primary) hypertension: Secondary | ICD-10-CM | POA: Insufficient documentation

## 2017-04-22 DIAGNOSIS — R81 Glycosuria: Secondary | ICD-10-CM | POA: Insufficient documentation

## 2017-04-22 DIAGNOSIS — E119 Type 2 diabetes mellitus without complications: Secondary | ICD-10-CM | POA: Insufficient documentation

## 2017-04-22 DIAGNOSIS — I152 Hypertension secondary to endocrine disorders: Secondary | ICD-10-CM | POA: Insufficient documentation

## 2017-04-27 ENCOUNTER — Ambulatory Visit: Payer: Self-pay | Attending: Internal Medicine

## 2017-04-30 ENCOUNTER — Ambulatory Visit: Payer: Self-pay | Admitting: Family Medicine

## 2017-05-01 ENCOUNTER — Ambulatory Visit (HOSPITAL_COMMUNITY)
Admission: RE | Admit: 2017-05-01 | Discharge: 2017-05-01 | Disposition: A | Discharge status: Home / Self Care | Payer: Self-pay | Source: Ambulatory Visit | Attending: Family Medicine | Admitting: Family Medicine

## 2017-05-01 ENCOUNTER — Ambulatory Visit (INDEPENDENT_AMBULATORY_CARE_PROVIDER_SITE_OTHER): Payer: Self-pay | Admitting: Family Medicine

## 2017-05-01 ENCOUNTER — Encounter: Payer: Self-pay | Admitting: Family Medicine

## 2017-05-01 VITALS — BP 134/72 | HR 91 | Temp 98.8°F | Resp 16 | Ht 59.0 in | Wt 159.0 lb

## 2017-05-01 DIAGNOSIS — Z789 Other specified health status: Secondary | ICD-10-CM

## 2017-05-01 DIAGNOSIS — M5441 Lumbago with sciatica, right side: Secondary | ICD-10-CM | POA: Insufficient documentation

## 2017-05-01 DIAGNOSIS — F32A Depression, unspecified: Secondary | ICD-10-CM

## 2017-05-01 DIAGNOSIS — G8929 Other chronic pain: Secondary | ICD-10-CM | POA: Insufficient documentation

## 2017-05-01 DIAGNOSIS — M5442 Lumbago with sciatica, left side: Secondary | ICD-10-CM | POA: Insufficient documentation

## 2017-05-01 DIAGNOSIS — F329 Major depressive disorder, single episode, unspecified: Secondary | ICD-10-CM

## 2017-05-01 DIAGNOSIS — M5136 Other intervertebral disc degeneration, lumbar region: Secondary | ICD-10-CM | POA: Insufficient documentation

## 2017-05-01 DIAGNOSIS — M4186 Other forms of scoliosis, lumbar region: Secondary | ICD-10-CM | POA: Insufficient documentation

## 2017-05-01 MED ORDER — CITALOPRAM HYDROBROMIDE 20 MG PO TABS: 20.0000 mg | ORAL_TABLET | Freq: Every day | ORAL | 1 refills | Status: DC

## 2017-05-01 MED ORDER — KETOROLAC TROMETHAMINE 60 MG/2ML IM SOLN
60.0000 mg | Freq: Once | INTRAMUSCULAR | Status: AC
  Administered 2017-05-01: 60 mg via INTRAMUSCULAR

## 2017-05-01 MED ORDER — ACETAMINOPHEN-CODEINE #3 300-30 MG PO TABS: 1.0000 | ORAL_TABLET | Freq: Two times a day (BID) | ORAL | 0 refills | Status: DC | PRN

## 2017-05-01 NOTE — Patient Instructions (Signed)
You have received a Tramadol 60 mg injection without complication for low back pain Will follow up by phone after reviewed xray of lumbar spine. Sent referral to orthopedic services     Citica (Sciatica) La citica es el dolor, la debilidad, el entumecimiento o el hormigueo a lo largo del nervio citico. El nervio citico comienza en la parte inferior de la espalda y desciende por la parte posterior de cada pierna. La citica se produce cuando este nervio se comprime o se ejerce presin sobre l. Suele desaparecer por s sola o con tratamiento. A veces, la citica puede volver a aparecer. CUIDADOS EN EL HOGAR Medicamentos  [employer] de venta libre y los recetados solamente como se lo haya indicado el mdico.  No conduzca ni use maquinaria pesada mientras toma analgsicos recetados. Control del dolor  Si se lo indican, aplique hielo sobre la zona afectada. ? Ponga el hielo en una bolsa plstica. ? Coloque una [employer] piel y la bolsa de hielo. ? Coloque el hielo durante , 2 a 3veces por da.  Despus del hielo, aplique calor sobre la zona afectada antes de hacer ejercicio o con la frecuencia que le haya indicado el mdico. Use la fuente de calor que el mdico le indique, por ejemplo, una compresa de calor hmedo o una almohadilla trmica. ? Coloque una [employer] piel y la fuente de Airline pilot. ? Aplique el calor durante 20 a . ? Retire la fuente de calor si la piel se le pone de color rojo brillante. Esto es muy importante si no puede sentir el dolor, el calor o el fro. Puede correr un riesgo mayor de sufrir quemaduras. Actividad  Retome sus actividades habituales como se lo haya indicado el mdico. Pregntele al mdico qu actividades son seguras para usted. ? Evite las actividades que empeoran la citica.  Descanse por breves perodos Administrator. Hgalo recostado o de pie. Esto suele ser mejor que descansar sentado. ? Cuando descanse  durante perodos ms largos, haga alguna actividad fsica o un estiramiento entre los perodos de descanso. ? Evite estar sentado durante largos perodos sin moverse. Levntese y [place] al menos una vez cada hora.  Haga ejercicios y estrese con regularidad, como se lo indic el mdico.  No levante nada que pese ms de 10libras (4,5kg) mientras tenga sntomas de citica. ? Aunque no tenga sntomas, evite levantar objetos pesados. ? Evite levantar objetos pesados de forma repetida.  Al levantar objetos, hgalos siempre de una forma que sea segura para su cuerpo. Para esto, debe hacer lo siguiente: ? Flexione las rodillas. ? Mantenga el objeto cerca del cuerpo. ? No se tuerza. Instrucciones generales  Mantenga una buena postura. ? Evite reclinarse hacia adelante cuando est sentado. ? Evite encorvar la espalda mientras est de pie.  Mantenga un peso saludable.  Use calzado cmodo, que le d soporte al pie. Evite usar tacones.  Evite dormir sobre un colchn que sea demasiado blando o demasiado duro. Es posible que sienta menos dolor si duerme en un colchn con apoyo suficientemente firme para la espalda.  Concurra a todas las visitas de control como se lo haya indicado el mdico. Esto es importante. SOLICITE AYUDA SI:  Tiene un dolor con estas caractersticas: ? Lo despierta cuando est dormido. ? Empeora al estar recostado. ? Es Government social research officer que tena en el pasado. ? Dura ms de 4semanas.  Pierde peso sin proponrselo. SOLICITE AYUDA DE INMEDIATO SI:  No puede controlar la  orina (miccin) ni la evacuacin de la materia fecal (defecacin).  Tiene debilidad en alguna de estas zonas, y la debilidad empeora. ? La parte inferior de la espalda. ? La parte inferior del abdomen (pelvis). ? Los glteos. ? Las piernas.  Siente irritacin o inflamacin en la espalda.  Tiene sensacin de ardor al [employer]. Esta informacin no tiene Theme park manager el consejo del mdico.  Asegrese de hacerle al mdico cualquier pregunta que tenga. Document Released: 08/09/2010 Document Revised: 10/29/2015 Document Reviewed: 03/16/2015 Elsevier Interactive Patient Education  [employer].

## 2017-05-01 NOTE — Progress Notes (Signed)
Subjective:    Sara Harper is a 71 y.o. female with a history of type 2 diabetes mellitus and hypertension presents accompanied by daughter complaining of left hip pain over the past year. Patient established care in clinic on 04/20/2017. Patient was screened for autoimmune arthritis. All tests were negative. Patient was started on a trial of Diclofenac 75 mg BID. She has been taking medication consistently without sustained relief.  Patient primarily speaks spanish, her daughter is interpreting. Patient is visiting daughter from [place]. She was evaluated at [facility] on 04/07/2017 for sciatica on left side. She was prescribed Prednisone per tapered dose with moderate relief. She says that pain restarted greater than week ago. Pain is described as intermittent and sharp. The patient reports the hip pain is worse with weight bearing and is aggravated by walking. Aggravating symptoms include: going up and down stairs, inactivity, kneeling and lateral movements. Patient has had prior hip problems. She has not attempted OTC interventions to alleviate symptoms.  Patient is also complaining of worsening depression. She reports feelings of hopelessness and anhedonia. She has been out of citalopram 20 mg for 2 weeks.    She denies current suicidal and homicidal plan or intent.    Past Medical History:  Diagnosis Date  . Diabetes mellitus without complication (HCC)   . Hypertension    Social History   Social History  . Marital status: Divorced    Spouse name: N/A  . Number of children: N/A  . Years of education: N/A   Occupational History  . Not on file.   Social History Main Topics  . Smoking status: Never Smoker  . Smokeless tobacco: Never Used  . Alcohol use No  . Drug use: No  . Sexual activity: Not on file   Other Topics Concern  . Not on file   Social History Narrative  . No narrative on file   Immunization History  Administered Date(s) Administered  . Influenza,inj,Quad  PF,6+ Mos 04/20/2017   Review of Systems  Constitutional: Negative.   HENT: Negative.   Eyes: Negative.   Respiratory: Negative.   Cardiovascular: Negative.   Gastrointestinal: Negative.   Genitourinary: Negative.   Musculoskeletal: Positive for joint pain (left hip pain).  Skin: Negative.   Neurological: Negative.   Endo/Heme/Allergies: Negative.   Psychiatric/Behavioral: Positive for depression.    Objective:    BP 134/72 (BP Location: Left Arm, Patient Position: Sitting, Cuff Size: Normal)   Pulse 91   Temp 98.8 F (37.1 C) (Oral)   Resp 16   Ht  (1.499 m)   Wt 159 lb (72.1 kg)   SpO2 96%   BMI 32.11 kg/m  Physical Exam  Constitutional: She is oriented to person, place, and time and well-developed, well-nourished, and in no distress.  HENT:  Head: Normocephalic and atraumatic.  Right Ear: External ear normal.  Left Ear: External ear normal.  Nose: Nose normal.  Mouth/Throat: Oropharynx is clear and moist.  Eyes: Pupils are equal, round, and reactive to light. Conjunctivae and EOM are normal.  Neck: Normal range of motion. Neck supple.  Cardiovascular: Normal rate, regular rhythm, normal heart sounds and intact distal pulses.   Pulmonary/Chest: Effort normal and breath sounds normal.  Abdominal: Soft. Bowel sounds are normal.  Musculoskeletal:       Lumbar back: She exhibits decreased range of motion, tenderness and pain.       Left upper leg: She exhibits tenderness. She exhibits no swelling and no edema.  Neurological:  She is alert and oriented to person, place, and time. Gait normal.  Skin: Skin is warm and dry.  Psychiatric: Mood, memory, affect and judgment normal.   Assessment:  BP 134/72 (BP Location: Left Arm, Patient Position: Sitting, Cuff Size: Normal)   Pulse 91   Temp 98.8 F (37.1 C) (Oral)   Resp 16   Ht  (1.499 m)   Wt 159 lb (72.1 kg)   SpO2 96%   BMI 32.11 kg/m   Plan:  1. Chronic left-sided low back pain with bilateral  sciatica - ketorolac (TORADOL) injection 60 mg; Inject 2 mLs (60 mg total) into the muscle once. - Ambulatory referral to Sports Medicine - DG Lumbar Spine Complete; Future - acetaminophen-codeine (TYLENOL #3) 300-30 MG tablet; Take 1 tablet by mouth every 12 (twelve) hours as needed for moderate pain or severe pain.  Dispense: 15 tablet; Refill: 0  2. Depression, unspecified depression type Depression screen [facility] 2/9 05/01/2017 05/01/2017 04/20/2017 02/07/2015 12/29/2014  Decreased Interest 1 0 0 0 0  Down, Depressed, Hopeless 0 0  PHQ - 2 Score 0 0  Altered sleeping 1 - - - -  Tired, decreased energy 1 - - - -  Change in appetite 0 - - - -  Feeling bad or failure about yourself  0 - - - -  Trouble concentrating 1 - - - -  Moving slowly or fidgety/restless 0 - - - -  Suicidal thoughts 0 - - - -  PHQ-9 Score 5 - - - -    - citalopram (CELEXA) 20 MG tablet; Take 1 tablet (20 mg total) by mouth daily.  Dispense: 30 tablet; Refill: 1  3. Language barrier to communication Spanish is primary language. Patient's daughter is interpreting, refused video interpreter.     RTC: As previously scheduled for chronic conditions  The patient was given clear instructions to go to ER or return to medical center if symptoms do not improve, worsen or new problems develop. The patient verbalized understanding. Will notify patient with laboratory results.      Nolon Nations  MSN, FNP-C Patient Care [facility] [address] [place], [place] [address] [phone]

## 2017-05-03 DIAGNOSIS — M5441 Lumbago with sciatica, right side: Principal | ICD-10-CM

## 2017-05-03 DIAGNOSIS — M5442 Lumbago with sciatica, left side: Principal | ICD-10-CM

## 2017-05-03 DIAGNOSIS — F329 Major depressive disorder, single episode, unspecified: Secondary | ICD-10-CM | POA: Insufficient documentation

## 2017-05-03 DIAGNOSIS — F32A Depression, unspecified: Secondary | ICD-10-CM | POA: Insufficient documentation

## 2017-05-03 DIAGNOSIS — G8929 Other chronic pain: Secondary | ICD-10-CM | POA: Insufficient documentation

## 2017-05-07 ENCOUNTER — Telehealth: Payer: Self-pay | Admitting: Family Medicine

## 2017-05-07 NOTE — Telephone Encounter (Signed)
Pt called to speak with the financial dept. To check on the statu of her application since she is getting bill and well she need it for specialist appt. Please follow up (spanish)

## 2017-05-20 ENCOUNTER — Ambulatory Visit (INDEPENDENT_AMBULATORY_CARE_PROVIDER_SITE_OTHER): Payer: Self-pay | Admitting: Family Medicine

## 2017-05-20 VITALS — BP 146/90 | Ht 59.0 in | Wt 152.0 lb

## 2017-05-20 DIAGNOSIS — M5432 Sciatica, left side: Secondary | ICD-10-CM

## 2017-05-20 DIAGNOSIS — G8929 Other chronic pain: Secondary | ICD-10-CM

## 2017-05-20 DIAGNOSIS — M5441 Lumbago with sciatica, right side: Secondary | ICD-10-CM

## 2017-05-20 DIAGNOSIS — M5442 Lumbago with sciatica, left side: Secondary | ICD-10-CM

## 2017-05-20 DIAGNOSIS — M1990 Unspecified osteoarthritis, unspecified site: Secondary | ICD-10-CM

## 2017-05-20 MED ORDER — GABAPENTIN 100 MG PO CAPS: 100.0000 mg | ORAL_CAPSULE | Freq: Every day | ORAL | 2 refills | Status: DC

## 2017-05-20 MED ORDER — DICLOFENAC SODIUM 75 MG PO TBEC: 75.0000 mg | DELAYED_RELEASE_TABLET | Freq: Two times a day (BID) | ORAL | 3 refills | Status: DC

## 2017-05-20 NOTE — Progress Notes (Signed)
HPI  CC: Low back pain Patient is presenting today with chronic low back pain.  She states that she has been seen for this many times in the past but she recently moved to this area.  She has been dealing with this pain over the past 2 years.  Pain has been located mostly over the low back, primarily on the left side.  Over the past year she has developed bilateral symptoms into her legs.  She endorses electric, sharp, radiating pain into both of her legs, left more than the right.  She endorses some occasional numbness, and paresthesias.  No specific weakness in her legs.  She is looking for any help improving this pain.  She denies any recent falls, trauma, or injuries.  No history of prior low back injuries.  Traumatic: No  Location: Bilateral low back, left worse than the right Quality: Sharp, electric, paresthesias  Duration: Greater than 2 years Timing: Walking or standing for prolonged period  Improving/Worsening: Worsening Makes better: Rest Makes worse: Prolonged ambulation Associated symptoms: Radiating symptoms down both legs  Previous Interventions Tried: Anti-inflammatories, physical therapy, prednisone Dosepaks  Past Injuries: None Past Surgeries: Noncontributory Smoking: None Family Hx: Noncontributory  ROS: Per HPI; in addition no fever, no rash, no additional weakness, no additional numbness, no additional paresthesias, and no additional falls/injury.   Objective: BP (!) 146/90   Ht 4\' 11"  (1.499 m)   Wt 152 lb (68.9 kg)   BMI 30.70 kg/m  Gen: NAD, well groomed, a/o x3, normal affect.  CV: Well-perfused. Warm.  Resp: Non-labored.  Neuro: Sensation intact throughout. No gross coordination deficits.  Gait: Nonpathologic posture, unremarkable stride without signs of limp or balance issues. Back: Inspection yields no evidence of bony deformity, erythema, ecchymosis, or swelling.  Tenderness to palpation along the paralumbar musculature bilaterally.  Vague nonfocal  pain within the gluteal muscles bilaterally (L>R).  Worsening pain with lateral rotation at the hips.  Negative FABER.  Positive straight leg raise (mostly left side).  DTRs +2 right, DTRs are +1-2 left.  Strength slightly diminished with dorsiflexion on the left side, otherwise 5/5 throughout.  Sensation intact throughout.   Assessment and Plan:  Chronic left-sided low back pain with bilateral sciatica Patient is presenting with signs and symptoms consistent with bilateral lumbar radiculopathy/sciatica.  X-rays from previous PCP visit show significant lateral joint space collapse at multiple levels of the L-spine.  No specific red flag symptoms at this time however patient's worsening sciatica is concerning. -Obtain lumbar MRI. -I will contact patient to discuss MRI results over the phone.  Patient does not desire surgical intervention at this time however I feel she may be a good candidate for epidural spinal injections. (Will need a referral, but will wait for MRI results).  I have already had this discussion with patient. -Continue diclofenac -Initiate gabapentin 100-300 mg nightly -Discussed red flag symptoms that should elicit immediate follow-up.   Orders Placed This Encounter  Procedures  . MR Lumbar Spine Wo Contrast    Epic order Ins-100% financial asst from [facility] Wt 151/ ht 4'11/ yes claus/ fwp and pt    Standing Status:   Future    Standing Expiration Date:   07/20/2018    Order Specific Question:   What is the patient's sedation requirement?    Answer:   No Sedation    Order Specific Question:   Does the patient have a pacemaker or implanted devices?    Answer:   No  Order Specific Question:   Preferred imaging location?    Answer:   [facility] (table limit-550lbs)    Order Specific Question:   Radiology Contrast Protocol - do NOT remove file path    Answer:   \\[email or web address]    Meds ordered this encounter  Medications  . gabapentin  (NEURONTIN) 100 MG capsule    Sig: Take 1-3 capsules (100-300 mg total) by mouth at bedtime. Start with 100mg  (1 capsule). Then, if necessary, dose can be increased to 300mg  (3 capsules) at night.    Dispense:  90 capsule    Refill:  2  . diclofenac (VOLTAREN) 75 MG EC tablet    Sig: Take 1 tablet (75 mg total) by mouth 2 (two) times daily.    Dispense:  60 tablet    Refill:  3     Kathee DeltonIan D McKeag, MD,MS [facility] Fellow 05/20/2017 5:38 PM

## 2017-05-20 NOTE — Progress Notes (Signed)
Raynaldo Trisha MangleDiaz was the interpreter today.

## 2017-05-20 NOTE — Assessment & Plan Note (Addendum)
Patient is presenting with signs and symptoms consistent with bilateral lumbar radiculopathy/sciatica.  X-rays from previous PCP visit show significant lateral joint space collapse at multiple levels of the L-spine.  No specific red flag symptoms at this time however patient's worsening sciatica is concerning. -Obtain lumbar MRI. -I will contact patient to discuss MRI results over the phone.  Patient does not desire surgical intervention at this time however I feel she may be a good candidate for epidural spinal injections. (Will need a referral, but will wait for MRI results).  I have already had this discussion with patient. -Continue diclofenac -Initiate gabapentin 100-300 mg nightly -Discussed red flag symptoms that should elicit immediate follow-up.

## 2017-05-21 ENCOUNTER — Ambulatory Visit (INDEPENDENT_AMBULATORY_CARE_PROVIDER_SITE_OTHER): Payer: Self-pay | Admitting: Family Medicine

## 2017-05-21 ENCOUNTER — Encounter: Payer: Self-pay | Admitting: Family Medicine

## 2017-05-21 VITALS — BP 129/68 | HR 76 | Temp 98.8°F | Resp 14 | Ht 59.0 in | Wt 152.0 lb

## 2017-05-21 DIAGNOSIS — Z23 Encounter for immunization: Secondary | ICD-10-CM

## 2017-05-21 DIAGNOSIS — I10 Essential (primary) hypertension: Secondary | ICD-10-CM

## 2017-05-21 DIAGNOSIS — Z789 Other specified health status: Secondary | ICD-10-CM

## 2017-05-21 DIAGNOSIS — E119 Type 2 diabetes mellitus without complications: Secondary | ICD-10-CM

## 2017-05-21 LAB — POCT GLYCOSYLATED HEMOGLOBIN (HGB A1C): Hemoglobin A1C: 7.2

## 2017-05-21 MED ORDER — METFORMIN HCL 500 MG PO TABS: 500.0000 mg | ORAL_TABLET | Freq: Two times a day (BID) | ORAL | 5 refills | Status: DC

## 2017-05-21 NOTE — Patient Instructions (Addendum)
Aumentar la metformina a 500 mg dos veces al C.H. Robinson Worldwideda.  Tambin siga una dieta baja en grasas bajas en carbohidratos y aumente el ejercicio diario  Su meta A1C es menor que 7. Su azcar en la sangre en ayunas al despertar meta es entre 110-140.  Su LDL (meta del colesterol malo es menos que 100 El objetivo de la presin sangunea es < 140/90.  Recomiende una dieta baja en carbohidratos bajo en grasa, dividida sobre 5-6 pequeas comidas, aumente la ingesta de agua a 6-8 vasos, y 150 minutos por semana de ejercicio cardiovascular.   Tome sus medicamentos como se prescriben Asegrese de que est familiarizado con cada uno de sus medicamentos y lo que estn acostumbrados a Warehouse managertratar.  Si usted no est seguro de los medicamentos, por favor, lleve a Systems analystseguimiento Enviar la referencia para el examen de la vista por favor mantenga su cita de seguimiento programada.    La diabetes mellitus y los alimentos (Diabetes Mellitus and Food) Es importante que controle su nivel de azcar en la sangre (glucosa). El nivel de glucosa en sangre depende en gran medida de lo que usted come. Comer alimentos saludables en las cantidades Panamaadecuadas a lo largo del Futures traderda, aproximadamente a la misma hora CarMaxtodos los das, lo ayudar a Chief Operating Officercontrolar su nivel de Event organiserglucosa en sangre. Tambin puede ayudarlo a retrasar o Fish farm managerevitar el empeoramiento de la diabetes mellitus. Comer de Regions Financial Corporationmanera saludable incluso puede ayudarlo a Event organisermejorar el nivel de presin arterial y a Baristaalcanzar o Pharmacologistmantener un peso saludable. Entre las recomendaciones generales para alimentarse y Water quality scientistcocinar los alimentos de forma saludable, se incluyen las siguientes:  Respetar las comidas principales y comer colaciones con regularidad. Evitar pasar largos perodos sin comer con el fin de perder peso.  Seguir una dieta que consista principalmente en alimentos de origen vegetal, como frutas, vegetales, frutos secos, legumbres y cereales integrales.  Utilizar mtodos de coccin a baja temperatura,  como hornear, en lugar de mtodos de coccin a alta temperatura, como frer en abundante aceite. Trabaje con el nutricionista para aprender a Acupuncturistusar la informacin nutricional de las etiquetas de los alimentos. CMO PUEDEN AFECTARME LOS ALIMENTOS? Carbohidratos Los carbohidratos afectan el nivel de glucosa en sangre ms que cualquier otro tipo de alimento. El nutricionista lo ayudar a Chief Strategy Officerdeterminar cuntos carbohidratos puede consumir en cada comida y ensearle a contarlos. El recuento de carbohidratos es importante para mantener la glucosa en sangre en un nivel saludable, en especial si utiliza insulina o toma determinados medicamentos para la diabetes mellitus. Alcohol El alcohol puede provocar disminuciones sbitas de la glucosa en sangre (hipoglucemia), en especial si utiliza insulina o toma determinados medicamentos para la diabetes mellitus. La hipoglucemia es una afeccin que puede poner en peligro la vida. Los sntomas de la hipoglucemia (somnolencia, mareos y Administratordesorientacin) son similares a los sntomas de haber consumido mucho alcohol. Si el mdico lo autoriza a beber alcohol, hgalo con moderacin y siga estas pautas:  Las mujeres no deben beber ms de un trago por da, y los hombres no deben beber ms de dos tragos por Futures traderda. Un trago es igual a: ? 12 onzas (355 ml) de cerveza ? 5 onzas de vino (150 ml) de vino ? 1,5onzas (45ml) de bebidas espirituosas  No beba con el estmago vaco.  Mantngase hidratado. Beba agua, gaseosas dietticas o t helado sin azcar.  Las gaseosas comunes, los jugos y otros refrescos podran contener muchos carbohidratos y se Heritage managerdeben contar. QU ALIMENTOS NO SE RECOMIENDAN? Cuando haga las elecciones de Sylvesteralimentos,  es importante que recuerde que todos los alimentos son distintos. Algunos tienen menos nutrientes que otros por porcin, aunque podran tener la misma cantidad de caloras o carbohidratos. Es difcil darle al cuerpo lo que necesita cuando consume  alimentos con menos nutrientes. Estos son algunos ejemplos de alimentos que debera evitar ya que contienen muchas caloras y carbohidratos, pero pocos nutrientes:  Neurosurgeon trans (la mayora de los alimentos procesados incluyen grasas trans en la etiqueta de Informacin nutricional).  Gaseosas comunes.  Jugos.  Caramelos.  Dulces, como tortas, pasteles, rosquillas y [place].  Comidas fritas. QU ALIMENTOS PUEDO COMER? Consuma alimentos ricos en nutrientes, que nutrirn el cuerpo y lo mantendrn saludable. Los alimentos que debe comer tambin dependern de varios factores, como:  Las caloras que necesita.  Los medicamentos que toma.  Su peso.  El nivel de glucosa en [place].  El Compo de presin arterial.  El nivel de colesterol. Debe consumir una amplia variedad de alimentos, por ejemplo:  Protenas. ? Barrell de [employer]. ? Protenas con bajo contenido de grasas saturadas, como pescado, clara de huevo y frijoles. Evite las carnes procesadas.  Frutas y vegetales. ? [place] y Sports administrator que pueden ayudar a [employer] niveles sanguneos de [place], como [place], mangos y batatas.  Productos lcteos. ? Elija productos lcteos sin grasa o con bajo contenido de [place], como [place], yogur y [place].  Cereales, panes, pastas y arroz. ? Elija cereales integrales, como panes multicereales, avena en grano y arroz integral. Estos alimentos pueden ayudar a controlar la presin arterial.  Rosalin Hawking. ? Alimentos que contengan grasas saludables, como frutos secos, Chartered certified accountant, aceite de [place], aceite de canola y pescado. TODOS LOS QUE PADECEN DIABETES MELLITUS TIENEN EL MISMO PLAN DE COMIDAS? Dado que todas las personas que padecen diabetes mellitus son distintas, no hay un solo plan de comidas que funcione para todos. Es muy importante que se rena con un nutricionista que lo ayudar a crear un plan de comidas adecuado para usted. Esta informacin no tiene Theme park manager el consejo  del mdico. Asegrese de hacerle al mdico cualquier pregunta que tenga. Document Released: 10/14/2007 Document Revised: 07/28/2014 Document Reviewed: 06/03/2013 Elsevier Interactive Patient Education  2017 Elsevier Inc.  Recuento de carbohidratos para la diabetes mellitus en los adultos Carbohydrate Counting for Diabetes Mellitus, Adult El recuento de carbohidratos es un mtodo destinado a Midwife un registro de la cantidad de carbohidratos que se ingieren. La ingesta natural de carbohidratos aumenta la cantidad de azcar (glucosa) en la sangre. El recuento de la cantidad de carbohidratos que se ingieren sirve para que el nivel de glucosa sangunea (glucemia) permanezca dentro de los lmites normales, lo que ayuda a Pharmacologist la diabetes (diabetes mellitus) bajo control. Es importante saber la cantidad de carbohidratos que se pueden ingerir en cada comida sin correr Surveyor, minerals. Esto es Government social research officer. Un especialista en alimentacin y nutricin (nutricionista certificado) puede ayudarlo a crear un plan de alimentacin y a calcular la cantidad de carbohidratos que debe ingerir en cada comida y colacin. Los siguientes alimentos incluyen carbohidratos:  Granos, como panes y cereales.  Frijoles secos y productos con soja.  Verduras con almidn, como papas, guisantes y maz.  Nils Pyle y jugos de frutas.  Leche y Dentist.  Dulces y colaciones, como pasteles, galletas, caramelos, papas fritas y refrescos.  Cmo se calculan los carbohidratos? Hay dos maneras de calcular los carbohidratos de los alimentos. Puede usar cualquiera de [address] o [place] combinacin de [place]. Leer la etiqueta de informacin  nutricional de los alimentos envasados La lista de informacin nutricional est incluida en las etiquetas de casi todas las bebidas y los alimentos envasados de los [place]. Incluye lo siguiente:  El tamao de la porcin.  Informacin sobre los nutrientes de cada porcin,  incluidos los gramos (g) de carbohidratos por porcin.  Para usar la informacin nutricional:  Decida cuntas porciones va a comer.  Multiplique la cantidad de porciones por el nmero de carbohidratos por porcin.  El resultado es la cantidad total de carbohidratos que comer.  Conocer los tamaos de las porciones estndar de otros alimentos Cuando coma alimentos que contienen carbohidratos que no estn envasados o no incluyen la informacin nutricional en la etiqueta, debe medir las porciones para poder calcular la cantidad de carbohidratos:  Mida los alimentos que comer con una balanza de alimentos o una taza medidora, si es necesario.  Decida cuntas porciones de Programmer, systems.  Multiplique el nmero de porciones por15. La mayora de los alimentos con alto contenido de carbohidratos contienen unos 15g de carbohidratos por porcin. ? Por ejemplo, si come 8onzas (170g) de fresas, habr comido 2porciones y 30g de carbohidratos (2porciones x 15g=30g).  En el caso de las comidas que contienen mezclas de ms de un alimento, como las sopas y los guisos, debe calcular los carbohidratos de cada alimento que se incluye.  La siguiente World [employer] tamaos de las porciones estndar de los alimentos corrientes con alto contenido de carbohidratos. Cada una de estas porciones tiene aproximadamente 15g de carbohidratos:  pan de hamburguesa o muffin ingls.  onza (15ml) de almbar.  onza (14g) de gelatina.  1rebanada de pan.  1tortilla de seispulgadas.  3onzas (85g) de arroz o pasta cocidos.  4onzas (113g) de frijoles secos cocidos.  4onzas (113g) de verduras con almidn, como guisantes, maz o papas.  4onzas (113g) de cereal caliente.  4onzas (113g) de pur de papas o de una papa grande al horno.  4onzas (113g) de frutas en lata o congeladas.  4onzas ( ) de jugo de frutas.  4a 6galletas.  6croquetas de  pollo.  6onzas (170g) de cereales secos sin azcar.  6onzas (170g) de yogur descremado sin ningn agregado o de yogur endulzado con edulcorante artificial.  8onzas ( ) de [place].  8onzas (170g) de frutas frescas o una fruta pequea.  24onzas (680g) de palomitas de maz.  Ejemplo de recuento de carbohidratos Ejemplo de comida  3onzas (85g) de pechuga de pollo.  6onzas (170g) de arroz integral.  4onzas (113g) de maz.  8onzas ( ) de leche.  8onzas (170g) de fresas con crema batida sin azcar. Clculo de carbohidratos 1. Identifique los alimentos que contienen carbohidratos: ? Arroz. ? Maz. ? Leche. ? Jinny Sanders. 2. Calcule cuntas porciones come de cada alimento: ? 2 porciones de arroz. ? 1 porcin de maz. ? 1 porcin de leche. ? 1 porcin de fresas. 3. Multiplique cada nmero de porciones por 15g: ? 2 porciones de arroz x 15 g = 30 g. ? 1 porcin de maz x 15 g = 15 g. ? 1 porcin de leche x 15 g = 15 g. ? 1 porcin de fresas x 15 g = 15 g. 4. Sume todas las cantidades para conocer el total de gramos de carbohidratos consumidos: ? 30g + 15g + 15g + 15g = 75g de carbohidratos en total. Esta informacin no tiene como fin reemplazar el consejo del mdico. Asegrese de hacerle al mdico cualquier pregunta que tenga. Document Released: 09/29/2011 Document Revised: 06/24/2016 Document Reviewed:  12/19/2015 Elsevier Interactive Patient Education  [employer].

## 2017-05-21 NOTE — Progress Notes (Signed)
Subjective:    Almee Pelphrey is a 71 y.o. female with a history of type 2 diabetes mellitus and hypertension presents accompanied by daughter for a follow up of chronic conditons. Patient primarily speaks spanish, utilized video interpreter to assist with communication.  Patient states that she has been taking all prescribed medications consistently.  She is not increased activity level and has not been following a carbohydrate modified diet.  She denies dizziness, fatigue, paresthesias, polyuria, polydipsia, polyphagia, nausea, vomiting, and/or diarrhea.    Patient also has a history of chronic back pain.  She was evaluated by orthopedic specialist on May 20, 2017.  Patient has radiculopathy and sciatica.  Orthopedic specialist has ordered an MRI and states that patient is a good candidate for spinal injections.  Patient is to continue diclofenac and gabapentin was initiated.  Patient says that back pain has improved on current medication regimen and is without complaint on today. Past Medical History:  Diagnosis Date  . Diabetes mellitus without complication (HCC)   . Hypertension    Social History   Social History  . Marital status: Divorced    Spouse name: N/A  . Number of children: N/A  . Years of education: N/A   Occupational History  . Not on file.   Social History Main Topics  . Smoking status: Never Smoker  . Smokeless tobacco: Never Used  . Alcohol use No  . Drug use: No  . Sexual activity: Not on file   Other Topics Concern  . Not on file   Social History Narrative  . No narrative on file   Immunization History  Administered Date(s) Administered  . Influenza,inj,Quad PF,6+ Mos 04/20/2017   Review of Systems  Constitutional: Negative.   HENT: Negative.   Eyes: Negative.   Respiratory: Negative.   Cardiovascular: Negative.   Gastrointestinal: Negative.   Genitourinary: Negative.   Musculoskeletal: Positive for joint pain (left hip pain).  Skin: Negative.    Neurological: Negative.   Endo/Heme/Allergies: Negative.     Objective:    BP 129/68 (BP Location: Left Arm, Patient Position: Sitting, Cuff Size: Large)   Pulse 76   Temp 98.8 F (37.1 C) (Oral)   Resp 14   Ht 4\' 11"  (1.499 m)   Wt 152 lb (68.9 kg)   SpO2 98%   BMI 30.70 kg/m  Physical Exam  Constitutional: She is oriented to person, place, and time and well-developed, well-nourished, and in no distress.  HENT:  Head: Normocephalic and atraumatic.  Right Ear: External ear normal.  Left Ear: External ear normal.  Nose: Nose normal.  Mouth/Throat: Oropharynx is clear and moist.  Eyes: Pupils are equal, round, and reactive to light. Conjunctivae and EOM are normal.  Neck: Normal range of motion. Neck supple.  Cardiovascular: Normal rate, regular rhythm, normal heart sounds and intact distal pulses.   Pulmonary/Chest: Effort normal and breath sounds normal.  Abdominal: Soft. Bowel sounds are normal.  Neurological: She is alert and oriented to person, place, and time. Gait normal.  Skin: Skin is warm and dry.  Psychiatric: Mood, memory, affect and judgment normal.   Assessment:  BP 129/68 (BP Location: Left Arm, Patient Position: Sitting, Cuff Size: Large)   Pulse 76   Temp 98.8 F (37.1 C) (Oral)   Resp 14   Ht 4\' 11"  (1.499 m)   Wt 152 lb (68.9 kg)   SpO2 98%   BMI 30.70 kg/m   Plan:  1. Type 2 diabetes mellitus without complication, without long-term current  use of insulin (HCC) Hemoglobin A1c is 7.2, will change metformin to 500 mg twice daily with meals. Recommend a carbohydrate modified diet divided over 5-6 small meals throughout the day. Increase water intake to 4-5 bottles per day. Also recommend that patient increase his daily activity - HgB A1c - metFORMIN (GLUCOPHAGE) 500 MG tablet; Take 1 tablet (500 mg total) by mouth 2 (two) times daily with a meal.  2. Essential hypertension Blood pressure is at goal on current medication regimen, no changes  warranted on today. Reviewed urinalysis no proteinuria present  3. Language barrier to communication Patient primarily speaks Spanish utilizing video interpreter to assist with communication 4. Need for Tdap vaccination - Tdap vaccine greater than or equal to 7yo IM  5. Immunization due - Pneumococcal conjugate vaccine 13-valent   RTC: Patient will follow up in 3 months for chronic conditions   Yun Gutierrez Rennis PettyMoore Sahiti Joswick  MSN, FNP-C Patient Care [facility] [address]  [place], [place] [id number] [phone]

## 2017-05-22 LAB — POCT URINALYSIS DIP (DEVICE)
Bilirubin Urine: NEGATIVE
Glucose, UA: 100 mg/dL — AB
Ketones, ur: NEGATIVE mg/dL
LEUKOCYTES UA: NEGATIVE
Nitrite: NEGATIVE
PROTEIN: NEGATIVE mg/dL
SPECIFIC GRAVITY, URINE: 1.02 (ref 1.005–1.030)
UROBILINOGEN UA: 0.2 mg/dL (ref 0.0–1.0)
pH: 5.5 (ref 5.0–8.0)

## 2017-06-03 ENCOUNTER — Ambulatory Visit
Admission: RE | Admit: 2017-06-03 | Discharge: 2017-06-03 | Disposition: A | Discharge status: Home / Self Care | Payer: No Typology Code available for payment source | Source: Ambulatory Visit | Attending: Family Medicine | Admitting: Family Medicine

## 2017-06-03 DIAGNOSIS — M5441 Lumbago with sciatica, right side: Principal | ICD-10-CM

## 2017-06-03 DIAGNOSIS — G8929 Other chronic pain: Secondary | ICD-10-CM

## 2017-06-03 DIAGNOSIS — M5442 Lumbago with sciatica, left side: Principal | ICD-10-CM

## 2017-06-08 ENCOUNTER — Telehealth: Payer: Self-pay

## 2017-06-08 DIAGNOSIS — F32A Depression, unspecified: Secondary | ICD-10-CM

## 2017-06-08 DIAGNOSIS — F329 Major depressive disorder, single episode, unspecified: Secondary | ICD-10-CM

## 2017-06-08 MED ORDER — CITALOPRAM HYDROBROMIDE 20 MG PO TABS: 20.0000 mg | ORAL_TABLET | Freq: Every day | ORAL | 1 refills | Status: DC

## 2017-06-08 NOTE — Telephone Encounter (Signed)
Refill for celexa sent into pharmacy. Thanks!  

## 2017-06-17 ENCOUNTER — Telehealth: Payer: Self-pay

## 2017-06-18 NOTE — Telephone Encounter (Signed)
Called and spoke with daughter. She is asking about results from MRI which was done through Sports Medicine. I advised that she will need to call their office to inquire about results. Thanks!

## 2017-06-24 ENCOUNTER — Encounter: Payer: Self-pay | Admitting: Family Medicine

## 2017-06-24 ENCOUNTER — Ambulatory Visit (INDEPENDENT_AMBULATORY_CARE_PROVIDER_SITE_OTHER): Payer: Self-pay | Admitting: Family Medicine

## 2017-06-24 VITALS — BP 142/100 | Ht 59.0 in | Wt 158.0 lb

## 2017-06-24 DIAGNOSIS — M1990 Unspecified osteoarthritis, unspecified site: Secondary | ICD-10-CM

## 2017-06-24 DIAGNOSIS — G8929 Other chronic pain: Secondary | ICD-10-CM

## 2017-06-24 DIAGNOSIS — M5441 Lumbago with sciatica, right side: Secondary | ICD-10-CM

## 2017-06-24 DIAGNOSIS — M5442 Lumbago with sciatica, left side: Secondary | ICD-10-CM

## 2017-06-24 MED ORDER — DICLOFENAC SODIUM 75 MG PO TBEC: 75.0000 mg | DELAYED_RELEASE_TABLET | Freq: Two times a day (BID) | ORAL | 3 refills | Status: DC | PRN

## 2017-06-24 MED ORDER — GABAPENTIN 100 MG PO CAPS
100.0000 mg | ORAL_CAPSULE | Freq: Every day | ORAL | 3 refills | Status: DC
Start: 2017-06-24 — End: 2019-09-12

## 2017-06-24 NOTE — Progress Notes (Signed)
   HPI  CC: Lumbar radiculopathy Patient is here to discuss her low back pain with radiculopathy, and to go over the results from her MRI.  Patient states that she is feeling better overall and the medications we prescribed (diclofenac and gabapentin) have made a significant difference in her quality of life.  She continues to have some radicular symptoms specifically some achiness and pain that radiate down the posterior and lateral aspects of her legs bilaterally.  She denies any numbness, weakness, or paresthesias.  No recent falls, trauma, or injuries.  Patient is overall pleased with her improvement but is still concerned about her persistent symptoms.  Medications/Interventions Tried: OTC NSAIDs, diclofenac, and gabapentin. See HPI and/or previous note for associated ROS.  Objective: BP (!) 142/100   Ht 4\' 11"  (1.499 m)   Wt 158 lb (71.7 kg)   BMI 31.91 kg/m  Gen: NAD, well groomed, a/o x3, normal affect.  CV: Well-perfused. Warm.  Resp: Non-labored.  Neuro: Sensation intact throughout. No gross coordination deficits.  Gait: Nonpathologic posture, unremarkable stride without signs of limp or balance issues. Back: Inspection yields no evidence of bony deformity, erythema, ecchymosis, or swelling.  Tenderness to palpation along the paralumbar musculature bilaterally.  Positive SLR.  DTRs unchanged from previous.  Strength slightly diminished with dorsiflexion on the left side, otherwise 5/5 throughout.  Sensation intact throughout.  Assessment and plan:  Chronic left-sided low back pain with bilateral sciatica Patient continues to have some persistent discomfort and radicular symptoms bilaterally.  We discussed the MRI results at length.  Patient was insistent that she had no desire to seek surgical intervention.  She states that she has significant improvement in her pain with the diclofenac and gabapentin.  We discussed red flag symptoms and the fact that her symptoms may worsen  rather than improve due to the underlying etiology of arthritis. -Continue gabapentin and diclofenac.  The 3430-month prescription provided today. -Prescription for physical therapy provided today, and hopes to strengthen and stabilize her low back and pelvic girdle, and also help ensure healthy ambulation/gait. -Patient is to follow-up if her symptoms change or she changes her mind and she would like a referral for epidural steroid injections or surgical intervention. Otherwise, PRN.   Orders Placed This Encounter  Procedures  . Ambulatory referral to Physical Therapy    Referral Priority:   Routine    Referral Type:   Physical Medicine    Referral Reason:   Specialty Services Required    Requested Specialty:   Physical Therapy    Number of Visits Requested:   1    Meds ordered this encounter  Medications  . diclofenac (VOLTAREN) 75 MG EC tablet    Sig: Take 1 tablet (75 mg total) by mouth 2 (two) times daily as needed.    Dispense:  180 tablet    Refill:  3  . gabapentin (NEURONTIN) 100 MG capsule    Sig: Take 1-3 capsules (100-300 mg total) by mouth at bedtime. Start with 100mg  (1 capsule). Then, if necessary, dose can be increased to 300mg  (3 capsules) at night.    Dispense:  270 capsule    Refill:  3     Kathee DeltonIan D McKeag, MD,MS [facility] 06/24/2017 6:00 PM

## 2017-06-24 NOTE — Progress Notes (Signed)
Today's interpreter was Arta Bruceeynaldo Diaz.

## 2017-06-24 NOTE — Patient Instructions (Addendum)
[facility] [address] [place], [place] [phone]  Llame para programar su primera cita.  Tambin puedes probar la vitamina B6 100mg .

## 2017-06-24 NOTE — Assessment & Plan Note (Signed)
Patient continues to have some persistent discomfort and radicular symptoms bilaterally.  We discussed the MRI results at length.  Patient was insistent that she had no desire to seek surgical intervention.  She states that she has significant improvement in her pain with the diclofenac and gabapentin.  We discussed red flag symptoms and the fact that her symptoms may worsen rather than improve due to the underlying etiology of arthritis. -Continue gabapentin and diclofenac.  The 7825-month prescription provided today. -Prescription for physical therapy provided today, and hopes to strengthen and stabilize her low back and pelvic girdle, and also help ensure healthy ambulation/gait. -Patient is to follow-up if her symptoms change or she changes her mind and she would like a referral for epidural steroid injections or surgical intervention. Otherwise, PRN.

## 2017-07-22 ENCOUNTER — Telehealth: Payer: Self-pay

## 2017-07-22 DIAGNOSIS — F329 Major depressive disorder, single episode, unspecified: Secondary | ICD-10-CM

## 2017-07-22 DIAGNOSIS — F32A Depression, unspecified: Secondary | ICD-10-CM

## 2017-07-22 MED ORDER — CITALOPRAM HYDROBROMIDE 20 MG PO TABS: 20.0000 mg | ORAL_TABLET | Freq: Every day | ORAL | 1 refills | Status: DC

## 2017-07-22 NOTE — Telephone Encounter (Signed)
Refill sent into pharmacy. Thanks!  

## 2017-08-21 ENCOUNTER — Ambulatory Visit: Payer: Self-pay | Admitting: Family Medicine

## 2019-04-11 IMAGING — CR DG LUMBAR SPINE COMPLETE 4+V
5 series · 5 of 5 positions shown · non-contrast
Comparison: No prior .

CLINICAL DATA: Fall.  Pain.  Radiation down left lower extremity.

EXAM:
LUMBAR SPINE - COMPLETE 4+ VIEW

[t l-spine a.p.]
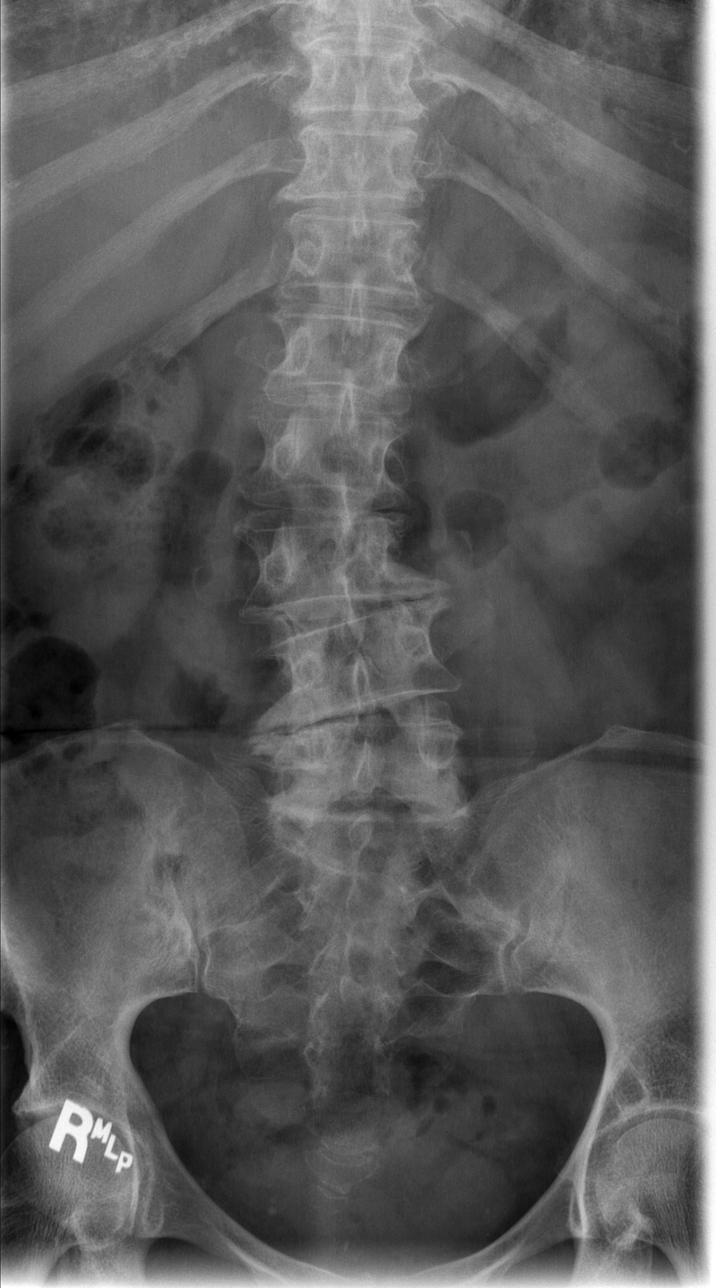

[t l-spine oblique exposure (1 of 2)]
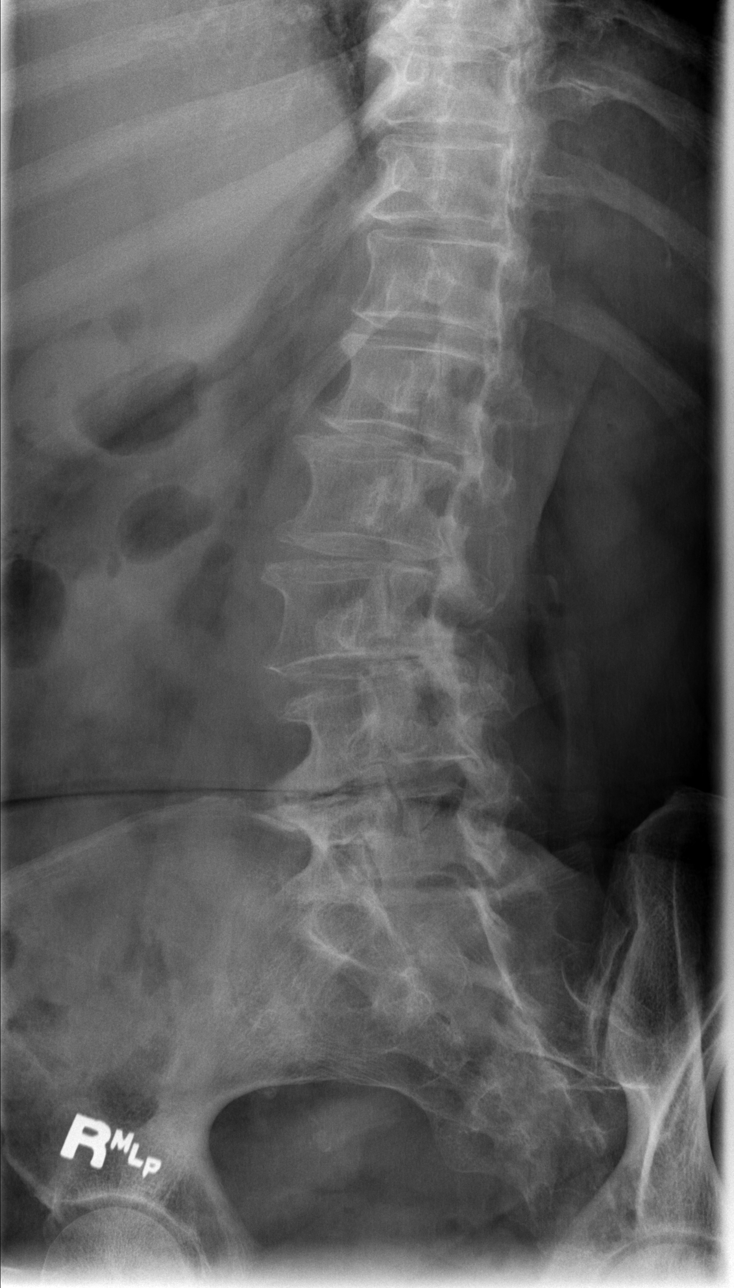

[t l-spine oblique exposure (2 of 2)]
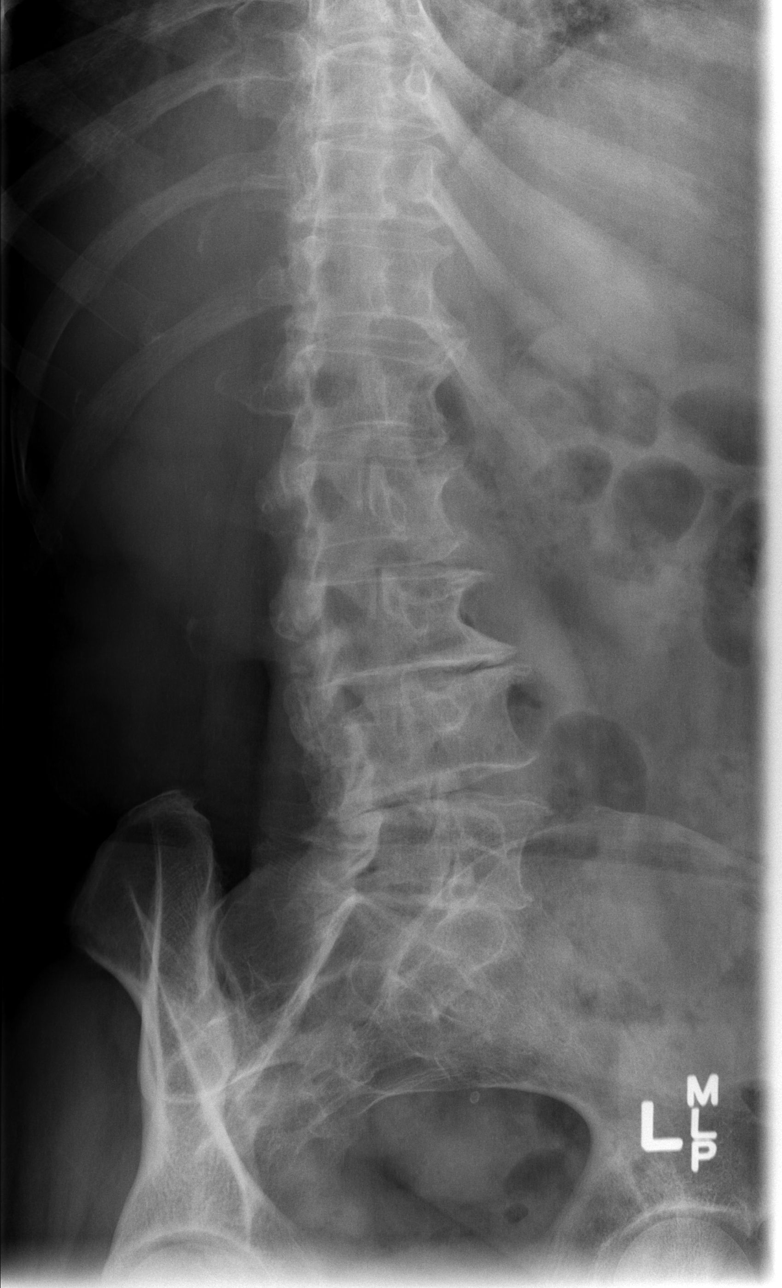

[t l-spine lat]
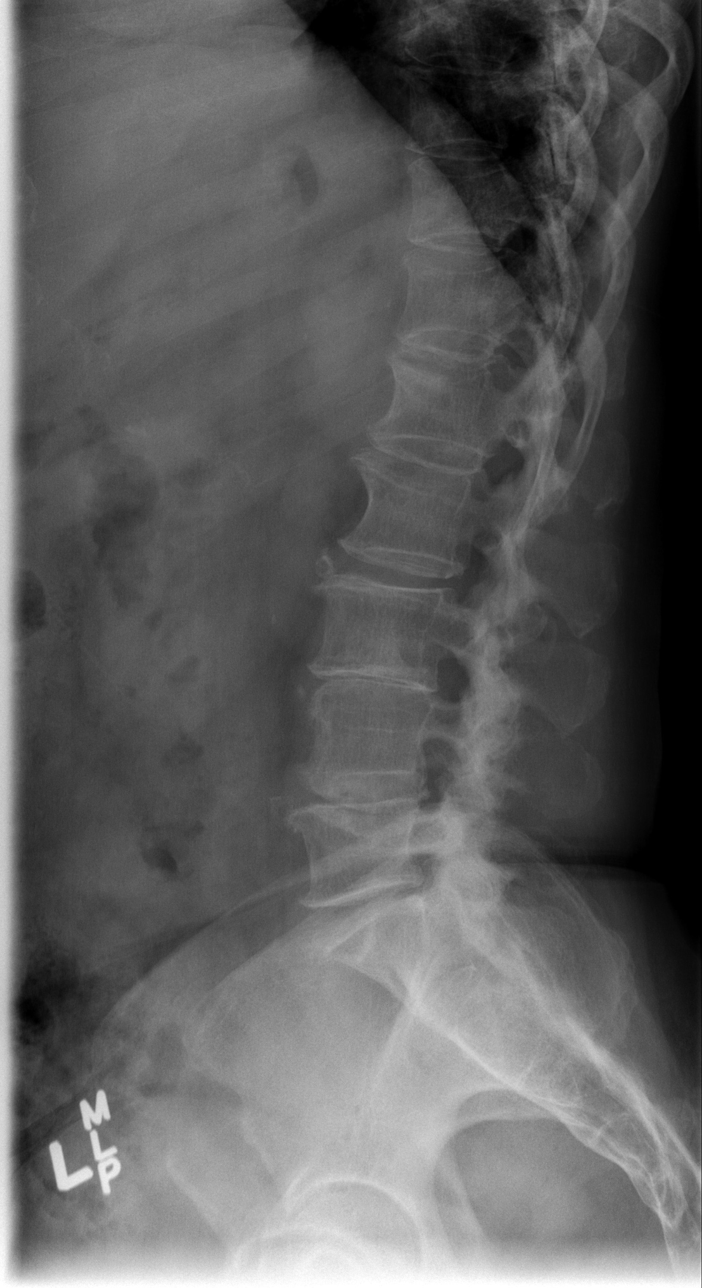

[t l-spine l5-s1 spot]
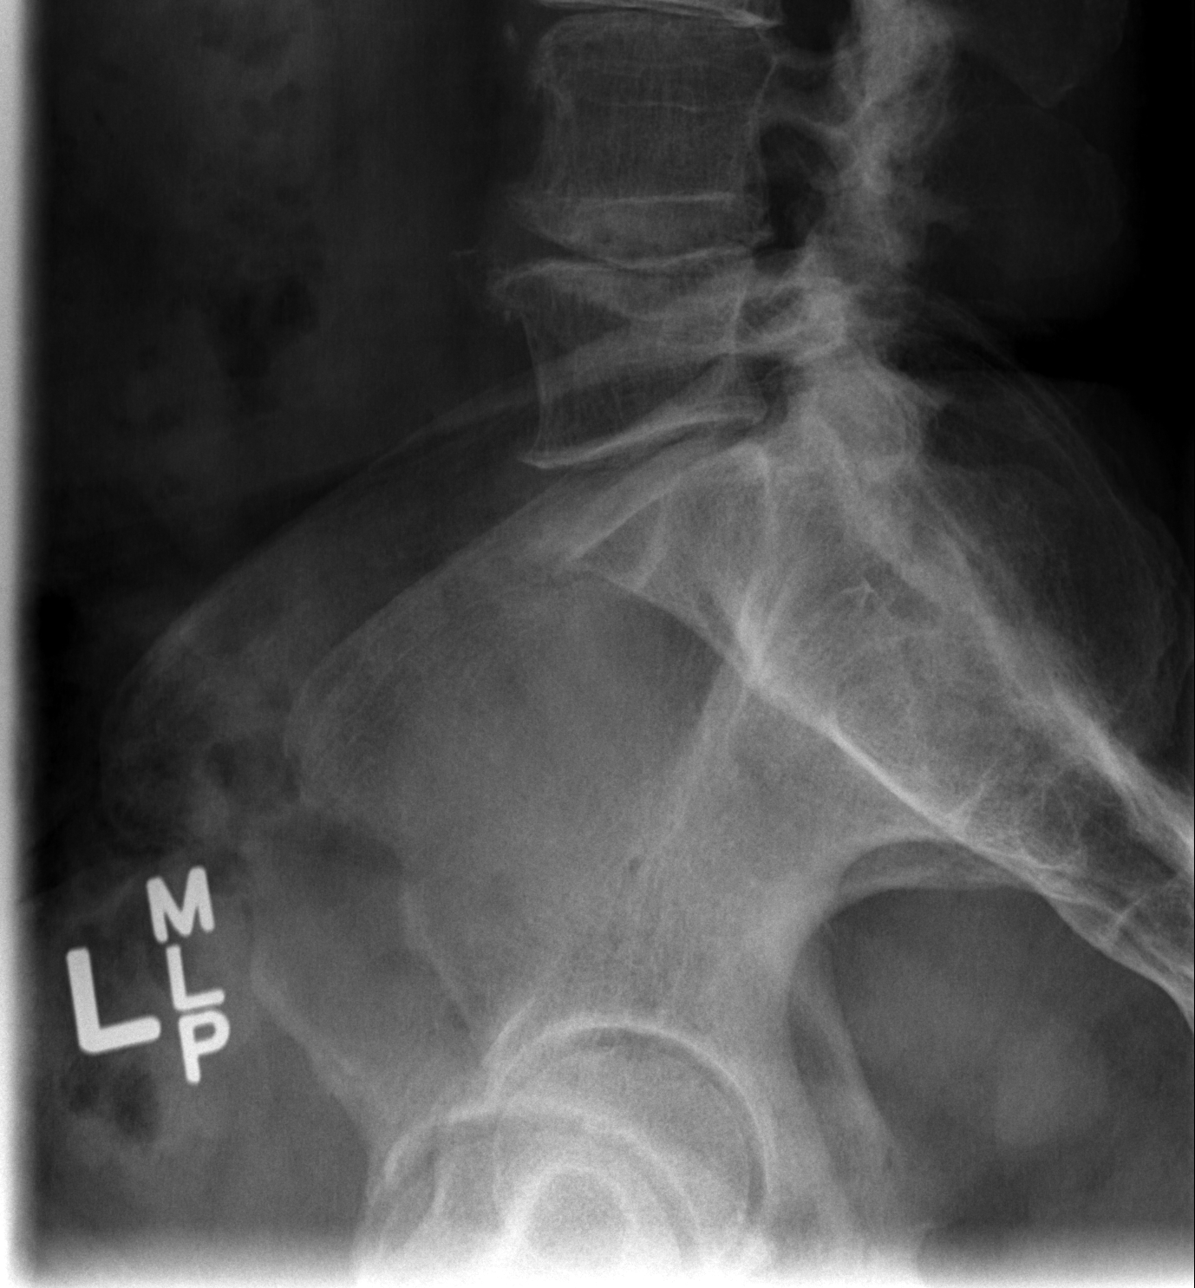

[5 of 5 positions shown; findings below may reference images not displayed]

FINDINGS: Prominent lumbar spine scoliosis concave left. Diffuse severe
multilevel change. No evidence of fracture. No acute abnormality .
Pelvic calcifications.
IMPRESSION: 1. Prominent lumbar scoliosis concave left with severe multilevel
degenerative change.

2.  No acute abnormality.

## 2019-09-11 ENCOUNTER — Ambulatory Visit: Payer: Self-pay | Attending: Internal Medicine

## 2019-09-11 DIAGNOSIS — Z23 Encounter for immunization: Secondary | ICD-10-CM | POA: Insufficient documentation

## 2019-09-11 NOTE — Progress Notes (Signed)
   Covid-19 Vaccination Clinic  Name:  Sara Harper    MRN: [id number] DOB: [birth year]  09/11/2019  [name] was observed post Covid-19 immunization for 15 minutes without incidence. She was provided with Vaccine Information Sheet and instruction to access the V-Safe system.   [name] was instructed to call 911 with any severe reactions post vaccine: Marland Kitchen Difficulty breathing  . Swelling of your face and throat  . A fast heartbeat  . A bad rash all over your body  . Dizziness and weakness    Immunizations Administered    Name Date Dose VIS Date Route   Pfizer COVID-19 Vaccine 09/11/2019  8:29 AM 0.3 mL 07/01/2019 Intramuscular   Manufacturer: [employer], [employer]   Lot: [id number]   NDC: [address]-9

## 2019-09-12 ENCOUNTER — Encounter: Payer: Self-pay | Admitting: Nurse Practitioner

## 2019-09-12 ENCOUNTER — Other Ambulatory Visit: Payer: Self-pay

## 2019-09-12 ENCOUNTER — Ambulatory Visit (INDEPENDENT_AMBULATORY_CARE_PROVIDER_SITE_OTHER): Payer: No Typology Code available for payment source | Admitting: Nurse Practitioner

## 2019-09-12 VITALS — BP 136/80 | HR 86 | Temp 98.1°F | Resp 16 | Ht 63.0 in | Wt 143.0 lb

## 2019-09-12 DIAGNOSIS — I10 Essential (primary) hypertension: Secondary | ICD-10-CM

## 2019-09-12 DIAGNOSIS — M1990 Unspecified osteoarthritis, unspecified site: Secondary | ICD-10-CM

## 2019-09-12 DIAGNOSIS — E119 Type 2 diabetes mellitus without complications: Secondary | ICD-10-CM

## 2019-09-12 DIAGNOSIS — F320 Major depressive disorder, single episode, mild: Secondary | ICD-10-CM

## 2019-09-12 LAB — POCT GLYCOSYLATED HEMOGLOBIN (HGB A1C): Hemoglobin A1C: 6.6 % — AB (ref 4.0–5.6)

## 2019-09-12 LAB — POCT URINALYSIS DIPSTICK
Bilirubin, UA: NEGATIVE
Blood, UA: NEGATIVE
Glucose, UA: NEGATIVE
Ketones, UA: NEGATIVE
Leukocytes, UA: NEGATIVE
Nitrite, UA: NEGATIVE
Protein, UA: NEGATIVE
Spec Grav, UA: 1.02 (ref 1.010–1.025)
Urobilinogen, UA: 0.2 E.U./dL
pH, UA: 6 (ref 5.0–8.0)

## 2019-09-12 MED ORDER — GABAPENTIN 100 MG PO CAPS: 100.0000 mg | ORAL_CAPSULE | Freq: Every day | ORAL | 3 refills | Status: DC

## 2019-09-12 MED ORDER — DICLOFENAC SODIUM 75 MG PO TBEC: 75.0000 mg | DELAYED_RELEASE_TABLET | Freq: Two times a day (BID) | ORAL | 3 refills | Status: DC | PRN

## 2019-09-12 NOTE — Progress Notes (Signed)
New Patient Office Visit  Subjective:  Patient ID: Sara Harper, female    DOB: 04/20/1946  Age: 74 y.o. MRN: [id number]  CC:  Chief Complaint  Patient presents with  . Hypertension  . Diabetes  . Leg Pain    right leg pain   . Depression    HPI Sara Harper presents to reestablish care. She  has a past medical history of Diabetes mellitus without complication (HCC) and Hypertension.  She was a previous patient of the clinic last seen in 2018. She has been in [place] for 6 mon and then she rotates.  She has been taking over-the-counter medications from a [place] metformina 850mg , enapril 10mg  and brucen 5mg . She admits that she has been making some dietary changes no longer eating sweets, tortilla etc.   She is having is having some burning and tingling in her right leg.  She admits that it makes it hard to walk.  She has taken anti-inflammatories however they have not been effective.  She admits that she did suffer a fall approximately 1 year ago while in .  She was previously on gabapentin 100 mg 3 capsules at bedtime along with diclofenac 75 mg twice daily.  She admits that this was effective for treating her leg pain.  She does feel like the pain does sometimes come from her lower back.  She has history of depression and has been on citalopram in the past however is not interested on restarting this.     Denies headache, dizziness, visual changes, shortness of breath, dyspnea on exertion, chest pain, nausea, vomiting or any edema.    Past Medical History:  Diagnosis Date  . Diabetes mellitus without complication (HCC)   . Hypertension     Past Surgical History:  Procedure Laterality Date  . ABDOMINAL HYSTERECTOMY    . CHOLECYSTECTOMY      History reviewed. No pertinent family history.  Social History   Socioeconomic History  . Marital status: Divorced    Spouse name: Not on file  . Number of children: Not on file  . Years of education: Not on file  . Highest  education level: Not on file  Occupational History  . Not on file  Tobacco Use  . Smoking status: Never Smoker  . Smokeless tobacco: Never Used  Substance and Sexual Activity  . Alcohol use: No    Alcohol/week: 0.0 standard drinks  . Drug use: No  . Sexual activity: Not on file  Other Topics Concern  . Not on file  Social History Narrative  . Not on file   Social Determinants of Health   Financial Resource Strain:   . Difficulty of Paying Living Expenses: Not on file  Food Insecurity:   . Worried About in the Last Year: Not on file  . Ran Out of Food in the Last Year: Not on file  Transportation Needs:   . Lack of Transportation (Medical): Not on file  . Lack of Transportation (Non-Medical): Not on file  Physical Activity:   . Days of Exercise per Week: Not on file  . Minutes of Exercise per Session: Not on file  Stress:   . Feeling of Stress : Not on file  Social Connections:   . Frequency of Communication with Friends and Family: Not on file  . Frequency of Social Gatherings with Friends and Family: Not on file  . Attends Religious Services: Not on file  . Active Member of Clubs or Organizations: Not  on file  . Attends Banker Meetings: Not on file  . Marital Status: Not on file  Intimate Partner Violence:   . Fear of Current or Ex-Partner: Not on file  . Emotionally Abused: Not on file  . Physically Abused: Not on file  . Sexually Abused: Not on file    ROS Review of Systems  Constitutional: Negative.   HENT: Negative.   Eyes: Negative.        Watery eyes   Respiratory: Negative.   Cardiovascular: Negative.   Gastrointestinal: Positive for constipation.  Endocrine: Negative.   Skin: Negative.   Allergic/Immunologic: Negative.   Neurological: Positive for numbness.    Objective:   Today's Vitals: BP 136/80 (BP Location: Left Arm, Patient Position: Sitting, Cuff Size: Normal)   Pulse 86   Temp 98.1 F (36.7 C) (Oral)    Resp 16   Ht 5\' 3"  (1.6 m)   Wt 143 lb (64.9 kg)   SpO2 97%   BMI 25.33 kg/m   Physical Exam Constitutional:      Appearance: Normal appearance.  HENT:     Head: Normocephalic.     Nose: Nose normal.     Mouth/Throat:     Mouth: Mucous membranes are moist.  Cardiovascular:     Rate and Rhythm: Normal rate and regular rhythm.  Pulmonary:     Effort: Pulmonary effort is normal.     Breath sounds: Normal breath sounds.  Abdominal:     General: Abdomen is flat.     Comments: Obese  Musculoskeletal:     Cervical back: Normal range of motion.     Comments: Guarding in the right leg  Skin:    General: Skin is warm and dry.  Neurological:     General: No focal deficit present.     Mental Status: She is alert and oriented to person, place, and time.  Psychiatric:        Mood and Affect: Mood normal.        Behavior: Behavior normal.        Thought Content: Thought content normal.        Judgment: Judgment normal.     Assessment & Plan:   Problem List Items Addressed This Visit      Unprioritized   Depression   Essential hypertension - Primary   Relevant Orders   Urinalysis Dipstick (Completed)   Comp. Metabolic Panel (12)   Type 2 diabetes mellitus without complication, without long-term current use of insulin (HCC)   Relevant Medications   glyBURIDE (DIABETA) 5 MG tablet   Other Relevant Orders   Urinalysis Dipstick (Completed)   HgB A1c (Completed)   Comp. Metabolic Panel (12)   CBC with Differential/Platelet   TSH    Other Visit Diagnoses    Arthritis          Outpatient Encounter Medications as of 09/12/2019  Medication Sig  . enalapril (VASOTEC) 10 MG tablet Take 1 tablet (10 mg total) by mouth daily.  09/14/2019 glyBURIDE (DIABETA) 5 MG tablet Take 5 mg by mouth daily with breakfast.  . metFORMIN (GLUCOPHAGE) 500 MG tablet Take 1 tablet (500 mg total) by mouth 2 (two) times daily with a meal.  . acetaminophen-codeine (TYLENOL #3) 300-30 MG tablet Take 1  tablet by mouth every 12 (twelve) hours as needed for moderate pain or severe pain. (Patient not taking: Reported on 09/12/2019)  . citalopram (CELEXA) 20 MG tablet Take 1 tablet (20 mg total) by mouth daily. (  Patient not taking: Reported on 09/12/2019)  . [DISCONTINUED] diclofenac (VOLTAREN) 75 MG EC tablet Take 1 tablet (75 mg total) by mouth 2 (two) times daily as needed. (Patient not taking: Reported on 09/12/2019)  . [DISCONTINUED] diclofenac (VOLTAREN) 75 MG EC tablet Take 1 tablet (75 mg total) by mouth 2 (two) times daily as needed.  . [DISCONTINUED] gabapentin (NEURONTIN) 100 MG capsule Take 1-3 capsules (100-300 mg total) by mouth at bedtime. Start with 100mg  (1 capsule). Then, if necessary, dose can be increased to 300mg  (3 capsules) at night. (Patient not taking: Reported on 09/12/2019)  . [DISCONTINUED] gabapentin (NEURONTIN) 100 MG capsule Take 1-3 capsules (100-300 mg total) by mouth at bedtime. Start with 100mg  (1 capsule). Then, if necessary, dose can be increased to 300mg  (3 capsules) at night.   No facility-administered encounter medications on file as of 09/12/2019.    Follow-up: Return in about 3 months (around 12/10/2019).   Vevelyn Francois, NP

## 2019-09-12 NOTE — Patient Instructions (Addendum)
Estreimiento en los adultos Constipation, Adult Se llama estreimiento cuando:  Tiene deposiciones (defeca) una menor cantidad de veces a la semana de lo normal.  Tiene dificultad para defecar.  Las heces son secas y duras o son ms grandes que lo normal. Siga estas indicaciones en su casa: Comida y bebida   Consuma alimentos con alto contenido de fibra, por ejemplo: ? Frutas y verduras frescas. ? Cereales integrales. ? Frijoles.  Consuma una menor cantidad de alimentos ricos en grasas, con bajo contenido de fibra o excesivamente procesados, como: ? Papas fritas. ? Hamburguesas. ? Galletas. ? Caramelos. ? Gaseosas.  Beba suficiente lquido para mantener el pis (orina) claro o de color amarillo plido. Instrucciones generales  Haga actividad fsica con regularidad o segn las indicaciones del mdico.  Vaya al bao cuando sienta la necesidad de defecar. No se aguante las ganas.  Tome los medicamentos de venta libre y los recetados solamente como se lo haya indicado el mdico. Estos incluyen los suplementos de fibra.  Realice ejercicios de reentrenamiento del suelo plvico, como: ? Respirar profundamente mientras relaja la parte inferior del vientre (abdomen). ? Relajar el suelo plvico mientras defeca.  Controle su afeccin para ver si hay cambios.  Concurra a todas las visitas de control como se lo haya indicado el mdico. Esto es importante. Comunquese con un mdico si:  Siente un dolor que empeora.  Tiene fiebre.  No ha defecado por 4das.  Vomita.  No tiene hambre.  Pierde peso.  Tiene una hemorragia en el ano.  Las deposiciones (heces) son delgadas como un lpiz. Solicite ayuda de inmediato si:  Tiene fiebre, y los sntomas empeoran de repente.  Tiene prdida de materia fecal u observa sangre en las heces.  Siente el vientre ms duro o ms grande de lo normal (est hinchado).  Siente un dolor muy intenso en el vientre.  Se siente mareado o se  desmaya. Esta informacin no tiene como fin reemplazar el consejo del mdico. Asegrese de hacerle al mdico cualquier pregunta que tenga. Document Revised: 10/08/2016 Document Reviewed: 12/26/2015 Elsevier Patient Education  2020 Elsevier Inc.  

## 2019-09-13 ENCOUNTER — Ambulatory Visit: Payer: No Typology Code available for payment source

## 2019-09-13 DIAGNOSIS — I10 Essential (primary) hypertension: Secondary | ICD-10-CM

## 2019-09-13 LAB — COMP. METABOLIC PANEL (12)
AST: 16 IU/L (ref 0–40)
Albumin/Globulin Ratio: 2.4 — ABNORMAL HIGH (ref 1.2–2.2)
Albumin: 4.6 g/dL (ref 3.7–4.7)
Alkaline Phosphatase: 48 IU/L (ref 39–117)
BUN/Creatinine Ratio: 24 (ref 12–28)
BUN: 19 mg/dL (ref 8–27)
Bilirubin Total: <0.2 mg/dL (ref 0.0–1.2)
Calcium: 9.5 mg/dL (ref 8.7–10.3)
Chloride: 99 mmol/L (ref 96–106)
Creatinine, Ser: 0.79 mg/dL (ref 0.57–1.00)
GFR calc Af Amer: 87 mL/min/{1.73_m2} (ref >59)
GFR calc non Af Amer: 76 mL/min/{1.73_m2} (ref >59)
Globulin, Total: 1.9 g/dL (ref 1.5–4.5)
Glucose: 131 mg/dL — ABNORMAL HIGH (ref 65–99)
Potassium: 4.3 mmol/L (ref 3.5–5.2)
Sodium: 137 mmol/L (ref 134–144)
Total Protein: 6.5 g/dL (ref 6.0–8.5)

## 2019-09-13 LAB — CBC WITH DIFFERENTIAL/PLATELET
Basophils Absolute: 0.1 10*3/uL (ref 0.0–0.2)
Basos: 1 %
EOS (ABSOLUTE): 0.3 10*3/uL (ref 0.0–0.4)
Eos: 5 %
Hematocrit: 37.8 % (ref 34.0–46.6)
Hemoglobin: 13.1 g/dL (ref 11.1–15.9)
Immature Grans (Abs): 0 10*3/uL (ref 0.0–0.1)
Immature Granulocytes: 0 %
Lymphocytes Absolute: 1.8 10*3/uL (ref 0.7–3.1)
Lymphs: 31 %
MCH: 32.2 pg (ref 26.6–33.0)
MCHC: 34.7 g/dL (ref 31.5–35.7)
MCV: 93 fL (ref 79–97)
Monocytes Absolute: 0.5 10*3/uL (ref 0.1–0.9)
Monocytes: 8 %
Neutrophils Absolute: 3.2 10*3/uL (ref 1.4–7.0)
Neutrophils: 55 %
Platelets: 252 10*3/uL (ref 150–450)
RBC: 4.07 x10E6/uL (ref 3.77–5.28)
RDW: 12.6 % (ref 11.7–15.4)
WBC: 5.7 10*3/uL (ref 3.4–10.8)

## 2019-09-13 LAB — TSH: TSH: 0.519 u[IU]/mL (ref 0.450–4.500)

## 2019-09-14 ENCOUNTER — Other Ambulatory Visit: Payer: Self-pay | Admitting: Nurse Practitioner

## 2019-09-14 ENCOUNTER — Telehealth: Payer: Self-pay

## 2019-09-14 DIAGNOSIS — E785 Hyperlipidemia, unspecified: Secondary | ICD-10-CM

## 2019-09-14 LAB — LIPID PANEL
Chol/HDL Ratio: 4.6 ratio — ABNORMAL HIGH (ref 0.0–4.4)
Cholesterol, Total: 213 mg/dL — ABNORMAL HIGH (ref 100–199)
HDL: 46 mg/dL (ref >39)
LDL Chol Calc (NIH): 137 mg/dL — ABNORMAL HIGH (ref 0–99)
Triglycerides: 169 mg/dL — ABNORMAL HIGH (ref 0–149)
VLDL Cholesterol Cal: 30 mg/dL (ref 5–40)

## 2019-09-14 MED ORDER — SIMVASTATIN 10 MG PO TABS: 10.0000 mg | ORAL_TABLET | Freq: Every evening | ORAL | 11 refills | Status: DC

## 2019-09-14 NOTE — Telephone Encounter (Signed)
-----   Message from Barbette Merino, NP sent at 09/14/2019  9:41 AM EST ----- Total cholesterol elevated along with triglycerides and LDL recommend doing the simvastatin nightlyPrescription sent

## 2019-09-14 NOTE — Telephone Encounter (Signed)
Called and spoke with patient's daughter, advised that cholesterol panel was abnormal and provider recommends starting simvastatin once daily in the evening. Advised that all other labs are stable and to continue all other medications. Asked that she keep next scheduled appointment. Thanks!

## 2019-10-04 ENCOUNTER — Ambulatory Visit: Payer: Self-pay | Attending: Internal Medicine

## 2019-10-04 DIAGNOSIS — Z23 Encounter for immunization: Secondary | ICD-10-CM

## 2019-10-04 NOTE — Progress Notes (Signed)
   Covid-19 Vaccination Clinic  Name:  Ernie Sagrero    MRN: [id number] DOB: Dec 22, 1945  10/04/2019  Ms. Deleon was observed post Covid-19 immunization for 15 minutes without incident. She was provided with Vaccine Information Sheet and instruction to access the V-Safe system.   Ms. Bischoff was instructed to call 911 with any severe reactions post vaccine: Marland Kitchen Difficulty breathing  . Swelling of face and throat  . A fast heartbeat  . A bad rash all over body  . Dizziness and weakness   Immunizations Administered    Name Date Dose VIS Date Route   Pfizer COVID-19 Vaccine 10/04/2019  2:54 PM 0.3 mL 07/01/2019 Intramuscular   Manufacturer: [employer], [employer]   Lot: [id number]   NDC: [address]-0

## 2019-10-06 ENCOUNTER — Ambulatory Visit (INDEPENDENT_AMBULATORY_CARE_PROVIDER_SITE_OTHER): Payer: No Typology Code available for payment source | Admitting: Nurse Practitioner

## 2019-10-06 ENCOUNTER — Other Ambulatory Visit: Payer: Self-pay

## 2019-10-06 ENCOUNTER — Encounter: Payer: Self-pay | Admitting: Nurse Practitioner

## 2019-10-06 ENCOUNTER — Ambulatory Visit: Payer: No Typology Code available for payment source | Admitting: Nurse Practitioner

## 2019-10-06 VITALS — BP 120/66 | HR 86 | Temp 99.4°F | Resp 14 | Ht 63.0 in | Wt 143.0 lb

## 2019-10-06 DIAGNOSIS — G8929 Other chronic pain: Secondary | ICD-10-CM

## 2019-10-06 DIAGNOSIS — Z0000 Encounter for general adult medical examination without abnormal findings: Secondary | ICD-10-CM

## 2019-10-06 DIAGNOSIS — E119 Type 2 diabetes mellitus without complications: Secondary | ICD-10-CM

## 2019-10-06 DIAGNOSIS — M5441 Lumbago with sciatica, right side: Secondary | ICD-10-CM

## 2019-10-06 DIAGNOSIS — R351 Nocturia: Secondary | ICD-10-CM

## 2019-10-06 DIAGNOSIS — I10 Essential (primary) hypertension: Secondary | ICD-10-CM

## 2019-10-06 LAB — POCT URINALYSIS DIP (CLINITEK)
Bilirubin, UA: NEGATIVE
Glucose, UA: NEGATIVE mg/dL
Leukocytes, UA: NEGATIVE
Nitrite, UA: NEGATIVE
POC PROTEIN,UA: NEGATIVE
Spec Grav, UA: 1.025 (ref 1.010–1.025)
Urobilinogen, UA: 0.2 E.U./dL
pH, UA: 6 (ref 5.0–8.0)

## 2019-10-06 MED ORDER — GABAPENTIN 300 MG PO CAPS: 300.0000 mg | ORAL_CAPSULE | Freq: Three times a day (TID) | ORAL | 0 refills | Status: AC

## 2019-10-06 MED ORDER — GLYBURIDE 5 MG PO TABS: 5.0000 mg | ORAL_TABLET | Freq: Every day | ORAL | 0 refills | Status: DC

## 2019-10-06 MED ORDER — MELATONIN 10 MG PO CAPS: 20.0000 mg | ORAL_CAPSULE | Freq: Every evening | ORAL | 2 refills | Status: DC | PRN

## 2019-10-06 MED ORDER — ENALAPRIL MALEATE 10 MG PO TABS: 10.0000 mg | ORAL_TABLET | Freq: Every day | ORAL | 0 refills | Status: DC

## 2019-10-06 MED ORDER — METFORMIN HCL 500 MG PO TABS: 500.0000 mg | ORAL_TABLET | Freq: Two times a day (BID) | ORAL | 0 refills | Status: DC

## 2019-10-06 NOTE — Progress Notes (Signed)
[facility] Patient [facility] [address] [place], [place]  [address] Phone:  [phone]   Fax:  [phone]   Established Patient Office Visit  Subjective:  Patient ID: Sara Harper, female    DOB: 04/06/1946  Age: 74 y.o. MRN: [id number]  CC:  Chief Complaint  Patient presents with  . Annual Exam    HPI Sara Harper presents for annual exam.  She  has a past medical history of Diabetes mellitus without complication (HCC) and Hypertension.  She is in today with her daughter.   She has a history of lower back pain that radiates down her right leg to her lower calf. She has burning and tingling in the entire leg.  She denies numbness.  She admits that she is able to walk around in her home however she cannot walk long distances.  She denies any recent falls or injuries.  She does not feel like the gabapentin 300 mg daily and the diclofenac 75 mg twice daily is effective.  She is currently taking the gabapentin 100 mg nightly and 200 mg each morning.  She has had x-ray and MRI.  She denies any previous surgery.  She was referred to sports medicine in the past.  She denies any previous physical therapy or interventional therapy.  She is also complaining of increased voiding at night which affects her sleep.  She states that she is up 2-3 times per night sometimes even 4 times.  She admits that she drinks water or tea to take her nighttime medications.  She denies any dysuria, hesitancy, hematuria.  She denies any urinary incontinence.  Past Medical History:  Diagnosis Date  . Diabetes mellitus without complication (HCC)   . Hypertension     Past Surgical History:  Procedure Laterality Date  . ABDOMINAL HYSTERECTOMY    . CHOLECYSTECTOMY      History reviewed. No pertinent family history.  Social History   Socioeconomic History  . Marital status: Divorced    Spouse name: Not on file  . Number of children: Not on file  . Years of education: Not on file  . Highest  education level: Not on file  Occupational History  . Not on file  Tobacco Use  . Smoking status: Never Smoker  . Smokeless tobacco: Never Used  Substance and Sexual Activity  . Alcohol use: No    Alcohol/week: 0.0 standard drinks  . Drug use: No  . Sexual activity: Not on file  Other Topics Concern  . Not on file  Social History Narrative  . Not on file   Social Determinants of Health   Financial Resource Strain:   . Difficulty of Paying Living Expenses:   Food Insecurity:   . Worried About Programme researcher, broadcasting/film/video in the Last Year:   . Barista in the Last Year:   Transportation Needs:   . Freight forwarder (Medical):   Marland Kitchen Lack of Transportation (Non-Medical):   Physical Activity:   . Days of Exercise per Week:   . Minutes of Exercise per Session:   Stress:   . Feeling of Stress :   Social Connections:   . Frequency of Communication with Friends and Family:   . Frequency of Social Gatherings with Friends and Family:   . Attends Religious Services:   . Active Member of Clubs or Organizations:   . Attends Banker Meetings:   Marland Kitchen Marital Status:   Intimate Partner Violence:   . Fear  of Current or Ex-Partner:   . Emotionally Abused:   Marland Kitchen Physically Abused:   . Sexually Abused:     Outpatient Medications Prior to Visit  Medication Sig Dispense Refill  . diclofenac (VOLTAREN) 75 MG EC tablet Take 1 tablet (75 mg total) by mouth 2 (two) times daily as needed. 180 tablet 3  . simvastatin (ZOCOR) 10 MG tablet Take 1 tablet (10 mg total) by mouth every evening. 30 tablet 11  . enalapril (VASOTEC) 10 MG tablet Take 1 tablet (10 mg total) by mouth daily. 30 tablet 1  . gabapentin (NEURONTIN) 100 MG capsule Take 1-3 capsules (100-300 mg total) by mouth at bedtime. Start with 100mg  (1 capsule). Then, if necessary, dose can be increased to 300mg  (3 capsules) at night. 270 capsule 3  . glyBURIDE (DIABETA) 5 MG tablet Take 5 mg by mouth daily with breakfast.    .  metFORMIN (GLUCOPHAGE) 500 MG tablet Take 1 tablet (500 mg total) by mouth 2 (two) times daily with a meal. 60 tablet 5  . acetaminophen-codeine (TYLENOL #3) 300-30 MG tablet Take 1 tablet by mouth every 12 (twelve) hours as needed for moderate pain or severe pain. (Patient not taking: Reported on 09/12/2019) 15 tablet 0  . citalopram (CELEXA) 20 MG tablet Take 1 tablet (20 mg total) by mouth daily. (Patient not taking: Reported on 09/12/2019) 30 tablet 1   No facility-administered medications prior to visit.    No Known Allergies  ROS Review of Systems  All other systems reviewed and are negative.     Objective:      BP 120/66 (BP Location: Left Arm, Patient Position: Sitting, Cuff Size: Large)   Pulse 86   Temp 99.4 F (37.4 C) (Oral)   Resp 14   Ht 5\' 3"  (1.6 m)   Wt 143 lb (64.9 kg)   SpO2 98%   BMI 25.33 kg/m  Wt Readings from Last 3 Encounters:  10/06/19 143 lb (64.9 kg)  09/12/19 143 lb (64.9 kg)  06/24/17 158 lb (71.7 kg)    General Appearance:  Alert, cooperative, no distress, appears stated age  Head:  Normocephalic, without obvious abnormality, atraumatic  Eyes:  PERRL, conjunctiva/corneas clear, EOM's intact, fundi benign, both eyes  Ears:  Normal TM's and external ear canals, both ears  Nose: Nares normal, septum midline,mucosa normal, no drainage or sinus tenderness  Throat: Lips, mucosa, and tongue normal; teeth and gums normal  Neck: Supple, symmetrical, trachea midline, no adenopathy;  thyroid: not enlarged, symmetric, no tenderness/mass/nodules; no carotid bruit or JVD  Back:   Symmetric, no curvature, ROM normal, no CVA tenderness  Lungs:   Clear to auscultation bilaterally, respirations unlabored  Breasts:  No masses or tenderness  Heart:  Regular rate and rhythm, S1 and S2 normal, no murmur, rub, or gallop  Abdomen:   Soft, non-tender, bowel sounds active all four quadrants,  no masses, no organomegaly  Pelvic: normal  Extremities: Extremities normal,  atraumatic, no cyanosis or edema  Pulses: 2+ and symmetric  Skin: Skin color, texture, turgor normal, no rashes or lesions  Lymph nodes: Cervical, supraclavicular, and axillary nodes normal  Neurologic: Normal     Health Maintenance Due  Topic Date Due  . Hepatitis C Screening  Never done  . OPHTHALMOLOGY EXAM  Never done  . MAMMOGRAM  Never done  . COLONOSCOPY  Never done  . DEXA SCAN  Never done  . FOOT EXAM  05/21/2018  . PNA vac Low Risk Adult (2 of  2 - PPSV23) 05/21/2018    There are no preventive care reminders to display for this patient.  Lab Results  Component Value Date   TSH 0.519 09/12/2019   Lab Results  Component Value Date   WBC 5.7 09/12/2019   HGB 13.1 09/12/2019   HCT 37.8 09/12/2019   MCV 93 09/12/2019   PLT 252 09/12/2019   Lab Results  Component Value Date   NA 137 09/12/2019   K 4.3 09/12/2019   CO2 25 04/20/2017   GLUCOSE 131 (H) 09/12/2019   BUN 19 09/12/2019   CREATININE 0.79 09/12/2019   BILITOT <0.2 09/12/2019   ALKPHOS 48 09/12/2019   AST 16 09/12/2019   PROT 6.5 09/12/2019   ALBUMIN 4.6 09/12/2019   CALCIUM 9.5 09/12/2019   Lab Results  Component Value Date   CHOL 213 (H) 09/13/2019   Lab Results  Component Value Date   HDL 46 09/13/2019   Lab Results  Component Value Date   LDLCALC 137 (H) 09/13/2019   Lab Results  Component Value Date   TRIG 169 (H) 09/13/2019   Lab Results  Component Value Date   CHOLHDL 4.6 (H) 09/13/2019   Lab Results  Component Value Date   HGBA1C 6.6 (A) 09/12/2019      Assessment & Plan:   Problem List Items Addressed This Visit      High   Essential hypertension   Relevant Medications   enalapril (VASOTEC) 10 MG tablet   Type 2 diabetes mellitus without complication, without long-term current use of insulin (HCC)   Relevant Medications   enalapril (VASOTEC) 10 MG tablet   glyBURIDE (DIABETA) 5 MG tablet   metFORMIN (GLUCOPHAGE) 500 MG tablet    Other Visit Diagnoses     Nocturia    -  Primary   Chronic bilateral low back pain with right-sided sciatica       Relevant Medications   gabapentin (NEURONTIN) 300 MG capsule   Other Relevant Orders   Ambulatory referral to Pain Clinic   Urine Culture   Healthcare maintenance       Relevant Orders   MM DIGITAL SCREENING BILATERAL   POCT URINALYSIS DIP (CLINITEK) (Completed)      Meds ordered this encounter  Medications  . enalapril (VASOTEC) 10 MG tablet    Sig: Take 1 tablet (10 mg total) by mouth daily.    Dispense:  90 tablet    Refill:  0    Order Specific Question:   Supervising Provider    Answer:   Tresa Garter [id number]  . glyBURIDE (DIABETA) 5 MG tablet    Sig: Take 1 tablet (5 mg total) by mouth daily with breakfast.    Dispense:  90 tablet    Refill:  0    Order Specific Question:   Supervising Provider    Answer:   Tresa Garter [id number]  . metFORMIN (GLUCOPHAGE) 500 MG tablet    Sig: Take 1 tablet (500 mg total) by mouth 2 (two) times daily with a meal.    Dispense:  180 tablet    Refill:  0    Order Specific Question:   Supervising Provider    Answer:   Tresa Garter [id number]  . Melatonin 10 MG CAPS    Sig: Take 20 mg by mouth at bedtime as needed. May start with 10mg     Dispense:  60 capsule    Refill:  2    Do not place this medication, or any other  prescription from our practice, on "Automatic Refill". Patient may have prescription filled one day early if pharmacy is closed on scheduled refill date.    Order Specific Question:   Supervising Provider    Answer:   Quentin Angst [id number]  . gabapentin (NEURONTIN) 300 MG capsule    Sig: Take 1 capsule (300 mg total) by mouth 3 (three) times daily.    Dispense:  270 capsule    Refill:  0    Order Specific Question:   Supervising Provider    Answer:   Quentin Angst [[id number]]    Follow-up: Return for Appointment As Scheduled.    Barbette Merino, NP

## 2019-10-06 NOTE — Patient Instructions (Signed)
Mantenimiento de Technical sales engineer en [place] Maintenance, Female Adoptar un estilo de vida saludable y recibir atencin preventiva son importantes para promover la salud y Musician. Consulte al mdico sobre:  El esquema adecuado para hacerse pruebas y exmenes peridicos.  Cosas que puede hacer por su cuenta para prevenir enfermedades y [employer]. Qu debo saber sobre la dieta, el peso y el ejercicio? Consuma una dieta saludable   Consuma una dieta que incluya muchas verduras, frutas, productos lcteos con bajo contenido de [place] y Advertising account planner.  No consuma muchos alimentos ricos en grasas slidas, azcares agregados o sodio. Mantenga un peso saludable El ndice de masa muscular [facility]) se [place] para identificar problemas de [place]. Proporciona una estimacin de la grasa corporal basndose en el peso y la altura. Su mdico puede ayudarle a Radiation protection practitioner [place] y a Scientist, forensic o Theatre manager un peso saludable. Haga ejercicio con regularidad Haga ejercicio con regularidad. Esta es una de las prcticas ms importantes que puede hacer por su salud. La mayora de los adultos deben seguir estas pautas:  Optometrist, al menos, 110minutos de actividad fsica por semana. El ejercicio debe aumentar la frecuencia cardaca y Nature conservation officer transpirar (ejercicio de intensidad moderada).  Hacer ejercicios de fortalecimiento por lo [employer] por semana. Agregue esto a su plan de ejercicio de intensidad moderada.  Pasar menos tiempo sentados. Incluso la actividad fsica ligera puede ser beneficiosa. Controle sus niveles de colesterol y lpidos en la sangre Comience a realizarse anlisis de lpidos y Research officer, trade union en la sangre a los 20aos y luego reptalos cada 5aos. Hgase controlar los niveles de colesterol con mayor frecuencia si:  Sus niveles de lpidos y colesterol son altos.  Es mayor de 40aos.  Presenta un alto riesgo de padecer enfermedades cardacas. Qu debo saber sobre las  pruebas de deteccin del cncer? Segn su historia clnica y sus antecedentes familiares, es posible que deba realizarse pruebas de deteccin del cncer en diferentes edades. Esto puede incluir pruebas de deteccin de lo siguiente:  Cncer de mama.  Cncer de cuello uterino.  Cncer colorrectal.  Cncer de piel.  Cncer de pulmn. Qu debo saber sobre la enfermedad cardaca, la diabetes y la hipertensin arterial? Presin arterial y enfermedad cardaca  La hipertensin arterial causa enfermedades cardacas y [place] el riesgo de accidente cerebrovascular. Es ms probable que esto se manifieste en las personas que tienen lecturas de presin arterial alta, tienen ascendencia africana o tienen sobrepeso.  Hgase controlar la presin arterial: ? Cada 3 a 5 aos si tiene entre 18 y 107 aos. ? Todos los aos si es mayor de [place]. Diabetes Realcese exmenes de deteccin de la diabetes con regularidad. Este anlisis revisa el nivel de azcar en la sangre en [place]. Hgase las pruebas de deteccin:  Cada tresaos despus de los 75aos de edad si tiene un peso normal y un bajo riesgo de padecer diabetes.  Con ms frecuencia y a partir de [place] edad inferior si tiene sobrepeso o un alto riesgo de padecer diabetes. Qu debo saber sobre la prevencin de infecciones? Hepatitis B Si tiene un riesgo ms alto de contraer hepatitis B, debe someterse a un examen de deteccin de este virus. Hable con el mdico para averiguar si tiene riesgo de contraer la infeccin por hepatitis B. Hepatitis C Se recomienda el anlisis a:  [employer] 1943 y 1965.  Todas las personas que tengan un riesgo de haber contrado hepatitis C. Enfermedades de transmisin sexual (ETS)  Hgase las pruebas de deteccin de ITS, incluidas la gonorrea y la clamidia, si: ? Es sexualmente activa y es menor de [place]. ? Es mayor de 24aos, y Public affairs consultant informa que corre riesgo de tener este tipo de  infecciones. ? La actividad sexual ha cambiado desde que le hicieron la ltima prueba de deteccin y tiene un riesgo mayor de Warehouse manager clamidia o Copy. Pregntele al mdico si usted tiene riesgo.  Pregntele al mdico si usted tiene un alto riesgo de Primary school teacher VIH. El mdico tambin puede recomendarle un medicamento recetado para ayudar a evitar la infeccin por el VIH. Si elige tomar medicamentos para prevenir el VIH, primero debe [employer] de deteccin del VIH. Luego debe hacerse anlisis cada mientras est tomando los medicamentos. Embarazo  Si est por dejar de Armed forces training and education officer (fase premenopusica) y usted puede quedar [place], busque asesoramiento antes de [place].  Tome de 400 a (mcg) de cido [employer] si [place].  Pida mtodos de control de la natalidad (anticonceptivos) si desea evitar un embarazo no deseado. Osteoporosis y [place] La osteoporosis es una enfermedad en la que los huesos pierden los minerales y la fuerza por el avance de la edad. El resultado pueden ser fracturas en los [place]. Si tiene 65aos o ms, o si est en riesgo de sufrir osteoporosis y fracturas, pregunte a su mdico si debe:  Hacerse pruebas de deteccin de prdida sea.  Tomar un suplemento de calcio o de vitamina D para reducir el riesgo de fracturas.  Recibir terapia de reemplazo hormonal (TRH) para tratar los sntomas de la menopausia. Siga estas instrucciones en su casa: Estilo de vida  No consuma ningn producto que contenga nicotina o tabaco, como cigarrillos, cigarrillos electrnicos y tabaco de Theatre manager. Si necesita ayuda para dejar de fumar, consulte al mdico.  No consuma drogas.  No comparta agujas.  Solicite ayuda a su mdico si necesita apoyo o informacin para abandonar las drogas. Consumo de alcohol  No beba alcohol si: ? Su mdico le indica no hacerlo. ? Est embarazada, puede estar embarazada o est tratando de quedar  embarazada.  Si bebe alcohol: ? Limite la cantidad que consume de 0 a 1 medida por da. ? Limite la ingesta si est amamantando.  Est atento a la cantidad de alcohol que hay en las bebidas que toma. En los [place], una medida equivale a una botella de cerveza de 12oz ( ), un vaso de vino de 5oz ( ) o un vaso de una bebida alcohlica de alta graduacin de 1oz (51ml). Instrucciones generales  Realcese los estudios de rutina de la salud, dentales y de Wellsite geologist.  Mantngase al da con las vacunas.  Infrmele a su mdico si: ? Se siente deprimida con frecuencia. ? Alguna vez ha sido vctima de [place] o no se siente segura en su casa. Resumen  Adoptar un estilo de vida saludable y recibir atencin preventiva son importantes para promover la salud y Counsellor.  Siga las instrucciones del mdico acerca de una dieta saludable, el ejercicio y la realizacin de pruebas o exmenes para Hotel manager.  Siga las instrucciones del mdico con respecto al control del colesterol y la presin arterial. Esta informacin no tiene Theme park manager el consejo del mdico. Asegrese de hacerle al mdico cualquier pregunta que tenga. Document Revised: 07/28/2018 Document Reviewed: 07/28/2018 Elsevier Patient Education  2020 [employer].   [employer] en los adultos Colonoscopy, Adult [place] colonoscopa es un examen que se Biomedical engineer  para examinar el intestino grueso. Se realiza [employer] de un tubo [place], fino y flexible que tiene una cmara en el extremo. Este examen se realiza para detectar si hay problemas, por ejemplo:  Bultos (tumores).  Crecimientos (plipos).  Irritacin e hinchazn(inflamacin).  Sangrado. Consulte al mdico acerca de lo siguiente:  Cualquier alergia que tenga.  Todos los [employer] toma. Infrmele sobre vitaminas, hierbas, gotas oftlmicas, cremas y [address] de [address].  Cualquier problema previo que usted o algn  miembro de su familia hayan tenido con los anestsicos.  Cualquier trastorno de la sangre que tenga.  Cirugas a las que se haya sometido.  Cualquier afeccin mdica que tenga.  Si est embarazada o podra estarlo.  Cualquier problema que haya tenido al defecar ( deposiciones). Cules son los riesgos? En general, se trata de un procedimiento seguro. Sin embargo, pueden presentarse problemas, por ejemplo:  Sangrado.  Dao intestinal.  Reacciones alrgicas a los medicamentos administrados durante el procedimiento.  Infeccin. Esto es poco frecuente. Qu ocurre antes del procedimiento? Comida y bebida Siga las indicaciones del mdico respecto de las comidas y las bebidas. Estas incluyen:  [employer] del procedimiento: ? Siga una dieta con bajo contenido de [place]. ? Evite estos alimentos:  Frutos secos.  Semillas.  Frutas pasas.  Frutas crudas.  Vegetales.  Entre 1 y 3 das antes del procedimiento: ? Coma solo postre de gelatina o helados de [place]. ? Beba solamente lquidos transparentes, por ejemplo:  [place].  Caldos o sopas transparentes.  Caf negro o t.  Jugos transparentes.  Refrescos o bebidas deportivas transparentes. ? No beba lquidos con colorante rojo o morado.  El da del procedimiento: ? No coma alimentos slidos. Puede continuar bebiendo lquidos claros hasta 2 horas antes del procedimiento. ? No coma ni beba nada a partir de 2 horas antes al procedimiento o segn le haya indicado el mdico. Preparado intestinal Si le recetaron un preparado intestinal para tomar por la boca (por va oral) para limpiar el colon:  Tmelo como se lo haya indicado el mdico. A partir del da anterior al procedimiento, tendr que beber una gran cantidad de un medicamento lquido. El lquido lo har defecar hasta que la materia fecal sea casi transparente o de color verde claro.  Si la piel o la zona anal se le irritan debido a Technical sales engineer, Scientist, product/process development lo  siguiente: ? Limpie la zona con toallitas que contengan productos medicinales, por ejemplo, toallitas hmedas para adultos con aloe y vitaminaE. ? Aplquese un producto en la piel que suavice la zona, como vaselina.  Si vomita mientras toma el preparado intestinal: ? Leanna Sato pausa durante 60 minutos como mximo. ? Comience a tomar el preparado nuevamente. ? Llame al mdico si sigue vomitando y no puede tomar el preparado intestinal sin vomitar.  Para limpiarle el colon, tambin pueden darle: ? Medicamentos laxantes. Estos ayudan a defecar. ? Instrucciones para el uso de un medicamento lquido (enema) inyectado en el ano. Medicamentos Consulte al mdico si debe hacer o no lo siguiente:  Multimedia programmer o suspender los medicamentos que recibe normalmente. Esto es importante.  Tomar aspirina e ibuprofeno. No tome estos medicamentos a menos que el mdico se lo indique.  Tomar medicamentos de [employer], vitaminas, hierbas y suplementos. Instrucciones generales  Pregntele al mdico qu medidas se tomarn para evitar la propagacin de grmenes. Estas pueden incluir lavar la piel con un jabn antisptico.  Prepare que alguien lo lleve a su casa desde el hospital  o la clnica. Qu ocurre durante el procedimiento?   Pueden colocarle un tubo (catter) intravenoso en una de las venas.  Pueden administrarle uno o ms de los siguientes medicamentos: ? Un medicamento para ayudar a relajarse (sedante). ? Un medicamento para adormecer la zona (anestesia local). ? Un medicamento que lo har dormir (anestesia general). Esto debe realizarse en contadas ocasiones.  Se recostar de costado con las rodillas flexionadas.  El tubo: ? Estar recubierto de aceite o gel. ? Se colocar en la abertura del ano. ? Se introducir suavemente en el intestino grueso.  Le aplicarn aire en el colon para mantenerlo abierto. Es posible que sienta presin o clicos.  La cmara se utilizar para tomar fotos que  Insurance underwriter.  Podrn tomarle una pequea muestra de tejido (biopsia) para examinarla.  Si se encuentran pequeos crecimientos, el mdico puede extirparlos y analizarlos para Landscape architect presencia de cncer.  El tubo se extraer lentamente. Este procedimiento puede variar segn el mdico y el hospital. Ladell Heads ocurre despus del procedimiento?  Lo controlarn hasta que deje el hospital o la [place]. Esto requiere el control de: ? Presin arterial. ? Frecuencia cardaca. ? Frecuencia respiratoria. ? Nivel de oxgeno en la sangre.  Es posible que encuentre una pequea cantidad de sangre en la materia fecal.  Puede eliminar gases.  Puede sentir clicos leves o distensin en el abdomen.  No conduzca durante 24horas despus del procedimiento.  Es su responsabilidad retirar [employer] del procedimiento. Pregntele al mdico o consulte en el departamento que Sales executive procedimiento cundo [employer]. Resumen  [place] colonoscopa es un examen que se realiza para examinar el intestino grueso.  Siga las instrucciones de su mdico acerca de comer y beber antes del procedimiento.  Es posible que le receten un preparado intestinal por va oral para limpiar el colon. Tmelo como se lo haya indicado el mdico.  Se introducir un tubo flexible con una cmara en su extremo en el orificio del ano. Se introducir en el intestino grueso.  Lo controlarn hasta que deje el hospital o la [place]. Esta informacin no tiene Theme park manager el consejo del mdico. Asegrese de hacerle al mdico cualquier pregunta que tenga. Document Revised: 03/23/2019 Document Reviewed: 03/23/2019 Elsevier Patient Education  2020 [employer].

## 2019-10-09 LAB — URINE CULTURE

## 2019-10-24 ENCOUNTER — Other Ambulatory Visit: Payer: Self-pay

## 2019-10-24 ENCOUNTER — Ambulatory Visit (INDEPENDENT_AMBULATORY_CARE_PROVIDER_SITE_OTHER): Payer: No Typology Code available for payment source | Admitting: Nurse Practitioner

## 2019-10-24 ENCOUNTER — Encounter: Payer: Self-pay | Admitting: Nurse Practitioner

## 2019-10-24 VITALS — BP 130/76 | HR 74 | Temp 98.9°F | Resp 14 | Ht 63.0 in | Wt 144.0 lb

## 2019-10-24 DIAGNOSIS — G8929 Other chronic pain: Secondary | ICD-10-CM

## 2019-10-24 DIAGNOSIS — M5441 Lumbago with sciatica, right side: Secondary | ICD-10-CM

## 2019-10-24 MED ORDER — MELOXICAM 15 MG PO TABS: 15.0000 mg | ORAL_TABLET | Freq: Every day | ORAL | 2 refills | Status: DC

## 2019-10-24 MED ORDER — TRIAMCINOLONE ACETONIDE 0.1 % EX CREA: 1.0000 "application " | TOPICAL_CREAM | Freq: Two times a day (BID) | CUTANEOUS | 0 refills | Status: DC

## 2019-10-24 NOTE — Patient Instructions (Addendum)
Dolor radicular Radicular Pain El dolor radicular es un tipo de dolor que se extiende desde la espalda o el cuello a lo largo del nervio raqudeo. Los nervios raqudeos se originan en la mdula espinal y llegan a los msculos. El dolor radicular a veces se conoce como radiculopata, radiculitis o pinzamiento de un nervio. Si tiene este tipo de Social research officer, government, tambin puede sentir debilidad, adormecimiento u hormigueo en la zona del cuerpo a la que llega ese nervio. El dolor puede ser agudo y sentirse como un ardor. Segn el nervio raqudeo que est afectado, el dolor puede aparecer en los siguientes lugares:  Zona del cuello (dolor radicular cervical). Tambin puede Education officer, environmental, adormecimiento, debilidad u hormigueo [employer].  Zona de la mitad de la columna (dolor radicular torcico). Se siente en la espalda y en el pecho. Este tipo es poco frecuente.  Zona inferior de la espalda (dolor radicular lumbar). Se siente un dolor lumbar. Tambin puede Education officer, environmental, adormecimiento, debilidad u hormigueo en las nalgas o en las piernas. La citica es un tipo de dolor radicular lumbar que se extiende por la parte de atrs de la pierna. El dolor radicular se produce cuando uno de los nervios espinales se irrita o se aprieta (comprime). A menudo se debe a algo que ejerce presin sobre un nervio espinal, como uno de los huesos de la columna vertebral (vrtebras) o una de las almohadillas redondas que estn entre las vrtebras (discos intervertebrales). Esto puede deberse a lo siguiente:  Una lesin.  El desgaste o el envejecimiento de un disco.  El crecimiento de un espoln seo que aprieta el [place]. El dolor radicular suele desaparecer cuando se siguen las indicaciones del mdico sobre cmo aliviarlo en la casa. Siga estas indicaciones en su casa: Control del dolor      Si se lo indican, aplique hielo sobre la zona afectada: ? Ponga el hielo en una bolsa plstica. ? Coloque una [employer] piel y Administrator. ? Coloque el hielo durante 42minutos, 2 a 3veces por da.  Si se lo indican, aplique calor en la zona afectada con la frecuencia que le haya indicado el mdico. Use la fuente de calor que el mdico le recomiende, como una compresa de calor hmedo o una almohadilla trmica. ? Coloque una [employer] piel y la fuente de Freight forwarder. ? Aplique calor durante 20 a 66minutos. ? Retire la fuente de calor si la piel se pone de color rojo brillante. Esto es especialmente importante si no puede sentir dolor, calor o fro. Puede correr un riesgo mayor de sufrir quemaduras. Actividad   No permanezca sentado o acostado durante largos perodos.  Intente mantenerse lo ms activo posible. Pregntele al mdico qu tipo de ejercicio o actividad es mejor para usted.  Evite las actividades que empeoren el dolor, como doblarse y Lexicographer objetos.  No levante ningn objeto que pese ms de 10libras (4,5kg) o el lmite de peso que le hayan indicado, hasta que el mdico le diga que puede [employer].  Use una tcnica adecuada al levantar objetos. La tcnica adecuada para levantar objetos consiste en doblar las rodillas y levantarse.  Haga ejercicios de fuerza y flexibilidad solamente como se lo haya indicado el mdico o el fisioterapeuta. Indicaciones generales  [employer] de venta libre y los recetados solamente como se lo haya indicado el mdico.  Est atento a cualquier [employer] sntomas.  Concurra a todas las visitas de seguimiento como se lo haya indicado el  mdico. Esto es importante. ? El mdico lo puede enviar a un fisioterapeuta para Microbiologist. Comunquese con un mdico si:  El dolor y otros sntomas empeoran.  El medicamento no Production designer, theatre/television/film.  El dolor no mejora despus de unas semanas de cuidado Financial planner.  Tiene fiebre. Solicite ayuda inmediatamente si:  Scientist, clinical (histocompatibility and immunogenetics), adormecimiento o debilidad intensos.  Tiene dificultad con el control de la vejiga o  los intestinos. Resumen  El dolor radicular es un tipo de dolor que se extiende desde la espalda o el cuello a lo largo del nervio raqudeo.  Si tiene dolor radicular, tambin puede sentir debilidad, adormecimiento u hormigueo en la zona del cuerpo a la que llega ese nervio.  El dolor puede ser agudo o sentirse como ardor.  El dolor radicular puede tratarse con hielo, calor, medicamentos o fisioterapia. Esta informacin no tiene Marine scientist el consejo del mdico. Asegrese de hacerle al mdico cualquier pregunta que tenga. Document Revised: 03/10/2018 Document Reviewed: 03/10/2018 Elsevier Patient Education  [place] cncer de piel, en adultos Preventing Skin Cancer, Adult El cncer de piel es el tipo de cncer ms frecuente. Hay tres tipos principales. El cncer de piel de clulas escamosas y el cncer de piel de clulas basales son los ms frecuentes. El cncer de piel tipo melanoma es el tipo ms peligroso. La causa de la mayora de los cnceres de piel son lesiones en la piel ocasionadas por la exposicin a la luz ultravioleta (UV). La luz UV proviene del sol y [place]. Los bronceados y las quemaduras de sol son el resultado de la exposicin a la luz UV. El cncer de piel ocurre con ms frecuencia en las personas [place], pero generalmente es el resultado del dao producido en etapas tempranas de la vida. Los bronceados y las quemaduras de sol que tiene a cualquier edad puede causar cncer de piel en el futuro. Para ayudar a prevenir esto, puede tomar medidas para protegerse. Qu medidas puedo tomar para protegerme del cncer de piel? A muchas personas les gusta broncearse, especialmente en verano o cuando van de vacaciones. Sin embargo, la piel bronceada o [place] es un signo de dao en la piel. Aumenta su riesgo de Chief Financial Officer de piel. Para disminuir el riesgo: Evite la exposicin a la luz UV   Trate de no exponerse al sol entre las  10a.m. y las 4p.m. siempre que sea posible. En ese horario es cuando los rayos de sol son ms fuertes. Permanezca a la [employer].  Recuerde que tambin puede estar expuesto a los rayos [employer] nublados o con neblina. La exposicin al sol puede ser [employer], no solo en el verano.  No use lmpara solar, cama solar ni cabina de bronceado para broncearse. Si realmente desea estar bronceado, use una locin bronceadoraartificial.  Evite las quemaduras de sol. Las quemaduras de sol son ms comunes en los das de sol radiante, especialmente cuando se encuentra en zonas donde el sol se refleja en el agua o la nieve. Use pantalla solar y ropa protectora   Siempre use pantalla solar, ya sea en crema, locin o aerosol, cuando est afuera al sol. Mantenga la pantalla solar a mano, por ejemplo, en su bolsa del gimnasio o en el automvil, de modo que la tenga cuando la necesite.  Use una pantalla solar con un factor de proteccin solar (FPS) de al menos 15. Use un FPS  de 30 o ms si est bajo el sol radiante, especialmente cuando est en la nieve o en el agua.  Asegrese de que la pantalla solar lo proteja de la luz UVA y UVB.  Use suficiente cantidad de protector solar para cubrir las zonas expuestas de la piel. Aplqueselo 30 minutos antes de salir. Vuelva a aplicrselo cada 2 horas o toda vez que salga del agua.  Cuando est al sol, use un sombrero de ala ancha y ropa que le cubra los brazos y las piernas. Use gafas de sol envolventes. Controle su piel para detectar cambios  Controle su piel de la cabeza a los pies con frecuencia para observar si hay cambios en el tamao, el color o la forma de los lunares o pecas. Controle si aparecen nuevos lunares o si hay lunares que sangran o comienzan a picar. Consulte al mdico si percibe cambios.  Pdale a su mdico que le realice un chequeo completo de la piel. Pregunte si debe ser parte de su examen fsico anual o si es  necesario que consulte a un especialista en piel (dermatlogo). Tome otras medidas de prevencin   Evite la exposicin a sustancias qumicas nocivas, como el arsnico. ? Haga analizar el agua de su hogar para ver si contiene arsnico y otras sustancias qumicas. ? Tome medidas de proteccin para evitar la exposicin a sustancias qumicas en el trabajo.  No fume ningn producto que contenga tabaco, como cigarrillos, cigarros, pipas y Administrator, Civil Service. Si necesita ayuda para dejar de fumar, consulte al mdico.  Mantenga sano su sistema inmunitario. ? Mantngase al da con todas las vacunas, incluida la vacuna contra el VPH (virus del papiloma [place]). ? Coma, como mnimo, 5porciones de frutas y [employer]. Por qu son importantes estos cambios? Aproximadamente 1 de cada 5 personas tendr cncer de piel. La mejor [employer] de reducir su riesgo es evitar daos en la piel de la luz UV. Si tiene adolescentes en su casa, estos deben saber que solo cinco quemaduras de sol graves en la adolescencia podran duplicar su riesgo de tener cncer de piel en el futuro. Si tiene nios pequeos, siempre asegrese de proteger su piel del sol. Estos cambios pueden ayudar a reducir su riesgo de cncer de piel, y Wellsite geologist otros beneficios para la salud, como los siguientes:  Proteger su piel del sol puede ayudar a Automotive engineer quemaduras de sol dolorosas, el Engineer, structural, y Heritage manager lesiones y Regulatory affairs officer en la piel. Esto es especialmente importante si: ? Tiene la piel blanca y plida, pecas y cabello rojo. ? Se quema fcilmente.  Evitar la exposicin a sustancias qumicas nocivas puede ayudar a prevenir daos en otros tejidos del cuerpo, [employer], y prevenir otros tipos de Database administrator.  Evitar fumar tabaco puede disminuir su riesgo de padecer otros tipos de cncer y otros problemas de salud.  Seguir una dieta saludable es bueno para su salud general. Qu puede suceder si no se  hacen cambios? Si no hace estos cambios, correr un mayor riesgo de sufrir cncer de piel. Si tiene cncer de piel, los tratamientos podran hacer perderle tiempo de [place] y modificar su aspecto debido a la formacin de cicatrices. El tipo ms peligroso de cncer de piel, el melanoma, puede ser mortal si no se detecta de forma temprana. Dnde encontrar apoyo Para obtener ms apoyo, hable con su mdico de cabecera o dermatlogo. Dnde encontrar ms informacin Obtenga ms informacin sobre el cncer de piel en los siguientes sitios:  The  Skin Cancer Foundation (Fundacin contra el Cncer de [place]): www.skincancer.org/prevention  Centers for Disease Control and Prevention (Centros para el Control y la Prevencin de Event organiser): LatePreviews.co.uk  [place] Resources of Dermatology (Academia Americana de Dermatologa): InfoExam.si Resumen  El cncer de piel es el tipo de cncer ms frecuente.  El cncer de piel tipo melanoma puede ser mortal si no se detecta precozmente.  Las [employer] de sol y el bronceado aumentan el riesgo de sufrir cncer de piel.  Proteger su piel de la luz UV es la mejor manera de prevenir el cncer de piel. Esta informacin no tiene Theme park manager el consejo del mdico. Asegrese de hacerle al mdico cualquier pregunta que tenga. Document Revised: 10/19/2017 Document Reviewed: 10/19/2017 Elsevier Patient Education  2020 [employer].

## 2019-10-24 NOTE — Progress Notes (Signed)
Established Patient Office Visit  Subjective:  Patient ID: Sara Harper, female    DOB: 05-12-1946  Age: 74 y.o. MRN: [id number]  CC:  Chief Complaint  Patient presents with  . Leg Pain    right leg pain . voltaren not helping     HPI [name] presents for leg pain. She  has a past medical history of Diabetes mellitus without complication (HCC) and Hypertension.  She has a history of lower back pain that radiates down her right leg to her lower calf. She has burning and tingling in the entire leg.  She denies numbness.   She denies any recent falls or injuries.  She does not feel like the gabapentin 100 mg daytime and 300 mg nightly and the diclofenac 75 mg twice daily is not effective.  She has had x-ray and MRI.  She denies any previous surgery.  She was referred to sports medicine in the past.  She denies any previous physical therapy or interventional therapy.  There was a referral placed for pain management at her last visit for evaluation.   Past Medical History:  Diagnosis Date  . Diabetes mellitus without complication (HCC)   . Hypertension     Past Surgical History:  Procedure Laterality Date  . ABDOMINAL HYSTERECTOMY    . CHOLECYSTECTOMY      History reviewed. No pertinent family history.  Social History   Socioeconomic History  . Marital status: Divorced    Spouse name: Not on file  . Number of children: Not on file  . Years of education: Not on file  . Highest education level: Not on file  Occupational History  . Not on file  Tobacco Use  . Smoking status: Never Smoker  . Smokeless tobacco: Never Used  Substance and Sexual Activity  . Alcohol use: No    Alcohol/week: 0.0 standard drinks  . Drug use: No  . Sexual activity: Not on file  Other Topics Concern  . Not on file  Social History Narrative  . Not on file   Social Determinants of Health   Financial Resource Strain:   . Difficulty of Paying Living Expenses:   Food Insecurity:   .  Worried About Programme researcher, broadcasting/film/video in the Last Year:   . Barista in the Last Year:   Transportation Needs:   . Freight forwarder (Medical):   Marland Kitchen Lack of Transportation (Non-Medical):   Physical Activity:   . Days of Exercise per Week:   . Minutes of Exercise per Session:   Stress:   . Feeling of Stress :   Social Connections:   . Frequency of Communication with Friends and Family:   . Frequency of Social Gatherings with Friends and Family:   . Attends Religious Services:   . Active Member of Clubs or Organizations:   . Attends Banker Meetings:   Marland Kitchen Marital Status:   Intimate Partner Violence:   . Fear of Current or Ex-Partner:   . Emotionally Abused:   Marland Kitchen Physically Abused:   . Sexually Abused:     Outpatient Medications Prior to Visit  Medication Sig Dispense Refill  . enalapril (VASOTEC) 10 MG tablet Take 1 tablet (10 mg total) by mouth daily. 90 tablet 0  . gabapentin (NEURONTIN) 300 MG capsule Take 1 capsule (300 mg total) by mouth 3 (three) times daily. 270 capsule 0  . glyBURIDE (DIABETA) 5 MG tablet Take 1 tablet (5 mg total) by mouth daily with  breakfast. 90 tablet 0  . Melatonin 10 MG CAPS Take 20 mg by mouth at bedtime as needed. May start with 10mg  60 capsule 2  . metFORMIN (GLUCOPHAGE) 500 MG tablet Take 1 tablet (500 mg total) by mouth 2 (two) times daily with a meal. 180 tablet 0  . simvastatin (ZOCOR) 10 MG tablet Take 1 tablet (10 mg total) by mouth every evening. 30 tablet 11  . diclofenac (VOLTAREN) 75 MG EC tablet Take 1 tablet (75 mg total) by mouth 2 (two) times daily as needed. 180 tablet 3   No facility-administered medications prior to visit.    No Known Allergies  ROS Review of Systems  All other systems reviewed and are negative.     Objective:    Physical Exam  Constitutional: She is oriented to person, place, and time. She appears well-developed.  HENT:  Head: Normocephalic.  Cardiovascular: Normal rate.   Pulmonary/Chest: Effort normal.  Musculoskeletal:        General: Normal range of motion.     Cervical back: Normal range of motion.  Neurological: She is alert and oriented to person, place, and time.  Skin: Skin is warm and dry.  Psychiatric: She has a normal mood and affect. Her behavior is normal. Judgment and thought content normal.    BP 130/76 (BP Location: Left Arm, Patient Position: Sitting, Cuff Size: Normal)   Pulse 74   Temp 98.9 F (37.2 C) (Oral)   Resp 14   Ht 5\' 3"  (1.6 m)   Wt 144 lb (65.3 kg)   SpO2 98%   BMI 25.51 kg/m  Wt Readings from Last 3 Encounters:  10/24/19 144 lb (65.3 kg)  10/06/19 143 lb (64.9 kg)  09/12/19 143 lb (64.9 kg)     Health Maintenance Due  Topic Date Due  . Hepatitis C Screening  Never done  . OPHTHALMOLOGY EXAM  Never done  . MAMMOGRAM  Never done  . COLONOSCOPY  Never done  . DEXA SCAN  Never done  . FOOT EXAM  05/21/2018  . PNA vac Low Risk Adult (2 of 2 - PPSV23) 05/21/2018    There are no preventive care reminders to display for this patient.  Lab Results  Component Value Date   TSH 0.519 09/12/2019   Lab Results  Component Value Date   WBC 5.7 09/12/2019   HGB 13.1 09/12/2019   HCT 37.8 09/12/2019   MCV 93 09/12/2019   PLT 252 09/12/2019   Lab Results  Component Value Date   NA 137 09/12/2019   K 4.3 09/12/2019   CO2 25 04/20/2017   GLUCOSE 131 (H) 09/12/2019   BUN 19 09/12/2019   CREATININE 0.79 09/12/2019   BILITOT <0.2 09/12/2019   ALKPHOS 48 09/12/2019   AST 16 09/12/2019   PROT 6.5 09/12/2019   ALBUMIN 4.6 09/12/2019   CALCIUM 9.5 09/12/2019   Lab Results  Component Value Date   CHOL 213 (H) 09/13/2019   Lab Results  Component Value Date   HDL 46 09/13/2019   Lab Results  Component Value Date   LDLCALC 137 (H) 09/13/2019   Lab Results  Component Value Date   TRIG 169 (H) 09/13/2019   Lab Results  Component Value Date   CHOLHDL 4.6 (H) 09/13/2019   Lab Results  Component Value  Date   HGBA1C 6.6 (A) 09/12/2019      Assessment & Plan:   Assessment  Primary Diagnosis & Pertinent Problem List: The encounter diagnosis was Chronic right-sided  low back pain with right-sided sciatica.  Visit Diagnosis: 1. Chronic right-sided low back pain with right-sided sciatica     Meds ordered this encounter  Medications  . meloxicam (MOBIC) 15 MG tablet    Sig: Take 1 tablet (15 mg total) by mouth daily.    Dispense:  30 tablet    Refill:  2    Order Specific Question:   Supervising Provider    Answer:   Tresa Garter [id number]  . triamcinolone cream (KENALOG) 0.1 %    Sig: Apply 1 application topically 2 (two) times daily.    Dispense:  456.6 g    Refill:  0    Order Specific Question:   Supervising Provider    Answer:   Tresa Garter [[id number]]    Follow-up: Return for Appointment As Scheduled.    Vevelyn Francois, NP

## 2019-10-28 ENCOUNTER — Ambulatory Visit (HOSPITAL_COMMUNITY)
Admission: EM | Admit: 2019-10-28 | Discharge: 2019-10-28 | Disposition: A | Discharge status: Home / Self Care | Payer: Medicaid Other | Attending: Urgent Care | Admitting: Urgent Care

## 2019-10-28 ENCOUNTER — Other Ambulatory Visit: Payer: Self-pay

## 2019-10-28 ENCOUNTER — Ambulatory Visit: Payer: No Typology Code available for payment source

## 2019-10-28 ENCOUNTER — Encounter (HOSPITAL_COMMUNITY): Payer: Self-pay

## 2019-10-28 DIAGNOSIS — H1013 Acute atopic conjunctivitis, bilateral: Secondary | ICD-10-CM

## 2019-10-28 DIAGNOSIS — H1132 Conjunctival hemorrhage, left eye: Secondary | ICD-10-CM

## 2019-10-28 MED ORDER — OLOPATADINE HCL 0.1 % OP SOLN: 1.0000 [drp] | Freq: Two times a day (BID) | OPHTHALMIC | 11 refills | Status: DC

## 2019-10-28 MED ORDER — CETIRIZINE HCL 10 MG PO TABS: 10.0000 mg | ORAL_TABLET | Freq: Every day | ORAL | 0 refills | Status: DC

## 2019-10-28 NOTE — ED Provider Notes (Signed)
[facility]   MRN: [id number] DOB: 04-21-1946  Subjective:   Sara Harper is a 74 y.o. female presenting for 2-day history of redness about her left eye.  Patient states she has had this problem before and wanted to make sure she got checked out.  She has been rubbing her eyes and felt itching.  Has been sneezing a lot and notes that she has a history of allergies but is not taking any medication.  Reports that she had this kind of redness years ago when she was in [place] and ended up resolving on its own.  Denies fever, vision change, eye trauma, eye drainage, redness of the skin about her eye, swelling.  No current facility-administered medications for this encounter.  Current Outpatient Medications:  .  enalapril (VASOTEC) 10 MG tablet, Take 1 tablet (10 mg total) by mouth daily., Disp: 90 tablet, Rfl: 0 .  gabapentin (NEURONTIN) 300 MG capsule, Take 1 capsule (300 mg total) by mouth 3 (three) times daily., Disp: 270 capsule, Rfl: 0 .  glyBURIDE (DIABETA) 5 MG tablet, Take 1 tablet (5 mg total) by mouth daily with breakfast., Disp: 90 tablet, Rfl: 0 .  Melatonin 10 MG CAPS, Take 20 mg by mouth at bedtime as needed. May start with 10mg , Disp: 60 capsule, Rfl: 2 .  meloxicam (MOBIC) 15 MG tablet, Take 1 tablet (15 mg total) by mouth daily., Disp: 30 tablet, Rfl: 2 .  metFORMIN (GLUCOPHAGE) 500 MG tablet, Take 1 tablet (500 mg total) by mouth 2 (two) times daily with a meal., Disp: 180 tablet, Rfl: 0 .  simvastatin (ZOCOR) 10 MG tablet, Take 1 tablet (10 mg total) by mouth every evening., Disp: 30 tablet, Rfl: 11 .  triamcinolone cream (KENALOG) 0.1 %, Apply 1 application topically 2 (two) times daily., Disp: 456.6 g, Rfl: 0   No Known Allergies  Past Medical History:  Diagnosis Date  . Diabetes mellitus without complication (HCC)   . Hypertension      Past Surgical History:  Procedure Laterality Date  . ABDOMINAL HYSTERECTOMY    . CHOLECYSTECTOMY      No family history  on file.  Social History   Tobacco Use  . Smoking status: Never Smoker  . Smokeless tobacco: Never Used  Substance Use Topics  . Alcohol use: No    Alcohol/week: 0.0 standard drinks  . Drug use: No    ROS   Objective:   Vitals: BP 131/71   Pulse 80   Temp 98.7 F (37.1 C) (Oral)   Resp 18   Ht 4\' 11"  (1.499 m)   Wt 144 lb (65.3 kg)   SpO2 98%   BMI 29.08 kg/m   Physical Exam Constitutional:      General: She is not in acute distress.    Appearance: Normal appearance. She is well-developed. She is not ill-appearing, toxic-appearing or diaphoretic.  HENT:     Head: Normocephalic and atraumatic.     Nose: Nose normal.     Mouth/Throat:     Mouth: Mucous membranes are moist.     Pharynx: Oropharynx is clear.  Eyes:     General: No scleral icterus.       Right eye: No foreign body, discharge or hordeolum.        Left eye: No foreign body, discharge or hordeolum.     Extraocular Movements: Extraocular movements intact.     Conjunctiva/sclera:     Right eye: Right conjunctiva is not injected. No chemosis, exudate or  hemorrhage.    Left eye: Left conjunctiva is not injected. Hemorrhage present. No chemosis or exudate.    Pupils: Pupils are equal, round, and reactive to light.      Comments: No swelling or tenderness of her eyelids.  Cardiovascular:     Rate and Rhythm: Normal rate.  Pulmonary:     Effort: Pulmonary effort is normal.  Skin:    General: Skin is warm and dry.  Neurological:     General: No focal deficit present.     Mental Status: She is alert and oriented to person, place, and time.  Psychiatric:        Mood and Affect: Mood normal.        Behavior: Behavior normal.        Thought Content: Thought content normal.        Judgment: Judgment normal.     Assessment and Plan :   1. Allergic conjunctivitis of both eyes   2. Subconjunctival hemorrhage of left eye     Counseled on conservative management of her subconjunctival hemorrhage.   Start Zyrtec and olopatadine eyedrops to address underlying issue of allergic conjunctivitis and allergic rhinitis. Counseled patient on potential for adverse effects with medications prescribed/recommended today, ER and return-to-clinic precautions discussed, patient verbalized understanding.    Jaynee Eagles, PA-C 10/28/19 1710

## 2019-10-28 NOTE — ED Triage Notes (Signed)
Pt c/o 4/10 tender, ache left eye and erythematous, itching started yesterday. Pt denies vision changes.

## 2019-10-31 ENCOUNTER — Ambulatory Visit
Admission: RE | Admit: 2019-10-31 | Discharge: 2019-10-31 | Disposition: A | Discharge status: Home / Self Care | Payer: No Typology Code available for payment source | Source: Ambulatory Visit | Attending: Nurse Practitioner | Admitting: Nurse Practitioner

## 2019-10-31 DIAGNOSIS — Z0000 Encounter for general adult medical examination without abnormal findings: Secondary | ICD-10-CM

## 2019-11-03 ENCOUNTER — Telehealth: Payer: Self-pay

## 2019-11-03 NOTE — Telephone Encounter (Signed)
Called, no answer. Left a message that mamo was normal and if any questions to call us back. Thanks!

## 2019-11-03 NOTE — Telephone Encounter (Signed)
-----   Message from Barbette Merino, NP sent at 11/02/2019 10:18 AM EDT ----- Please let Sara Harper aware that her mammogram was negative.  I see she has not received an appointment from the pain clinic.  I will put a call into them.  She may ask.

## 2019-11-09 ENCOUNTER — Ambulatory Visit
Payer: No Typology Code available for payment source | Admitting: Student in an Organized Health Care Education/Training Program

## 2019-11-15 ENCOUNTER — Encounter: Payer: Self-pay | Admitting: Orthopaedic Surgery

## 2019-11-15 ENCOUNTER — Ambulatory Visit (INDEPENDENT_AMBULATORY_CARE_PROVIDER_SITE_OTHER): Payer: Medicaid Other | Admitting: Orthopaedic Surgery

## 2019-11-15 ENCOUNTER — Other Ambulatory Visit: Payer: Self-pay

## 2019-11-15 ENCOUNTER — Ambulatory Visit (INDEPENDENT_AMBULATORY_CARE_PROVIDER_SITE_OTHER): Payer: Medicaid Other

## 2019-11-15 DIAGNOSIS — M5441 Lumbago with sciatica, right side: Secondary | ICD-10-CM

## 2019-11-15 DIAGNOSIS — G8929 Other chronic pain: Secondary | ICD-10-CM

## 2019-11-15 MED ORDER — PREDNISONE 5 MG (21) PO TBPK: ORAL_TABLET | ORAL | 0 refills | Status: DC

## 2019-11-15 MED ORDER — METHOCARBAMOL 500 MG PO TABS: 500.0000 mg | ORAL_TABLET | Freq: Two times a day (BID) | ORAL | 0 refills | Status: DC | PRN

## 2019-11-15 NOTE — Progress Notes (Addendum)
Office Visit Note   Patient: Sara Harper           Date of Birth: December 30, 1945           MRN: [id number] Visit Date: 11/15/2019              Requested by: Dorena Dew, FNP [address],  [place] [address] PCP: Dorena Dew, FNP   Assessment & Plan: Visit Diagnoses:  1. Chronic right-sided low back pain with right-sided sciatica     Plan: Impression is chronic right-sided lower back pain and right lower extremity radiculopathy.  I will start the patient on a Sterapred taper muscle relaxer.  We will also refer her to Dr. Ernestina Patches for epidural steroid injection.  She will follow-up with [place] as needed.  Use of language interpreter made the encounter more complex.  Follow-Up Instructions: Return if symptoms worsen or fail to improve.   Orders:  Orders Placed This Encounter  Procedures  . XR Lumbar Spine 2-3 Views   Meds ordered this encounter  Medications  . predniSONE (STERAPRED UNI-PAK 21 TAB) 5 MG (21) TBPK tablet    Sig: Take as directed    Dispense:  21 tablet    Refill:  0  . methocarbamol (ROBAXIN) 500 MG tablet    Sig: Take 1 tablet (500 mg total) by mouth 2 (two) times daily as needed.    Dispense:  20 tablet    Refill:  0      Procedures: No procedures performed   Clinical Data: No additional findings.   Subjective: Chief Complaint  Patient presents with  . Lower Back - Pain    HPI patient is a pleasant 74 year old Spanish-speaking female who presents to our clinic today with right lower back and leg pain.  This has been ongoing for the past several years and has recently worsened.  The pain is located to the right lower back, right lateral hip and radiates down the entire leg and into the foot.  He describes this as a constant ache with slight weakness.  Her pain is worse with walking, sitting as well as getting out of her truck.  She does use anti-inflammatory with moderate relief of symptoms.  She also notes constant burning and  tingling to her thigh.  She notes remote history of having done physical therapy without significant relief of symptoms.  She also notes 2 previous epidural steroid injections in [place] back in 2016 and 2017 with moderate relief of symptoms.  MRI from 2018 of the lumbar spine shows severe spinal stenosis at L3-4 with severe left foraminal stenosis, severe right and moderate left 5 S1 foraminal stenosis, severe right L4-5 foraminal stenosis and moderate left L2-3 foraminal stenosis.  Review of Systems as detailed in HPI.  All others reviewed and are negative.   Objective: Vital Signs: There were no vitals taken for this visit.  Physical Exam well-developed and well-nourished female in no acute distress.  Alert and oriented x3.  Ortho Exam examination of her lumbar spine reveals no spinous or paraspinous tenderness.  She does have moderately positive straight leg raise.  Negative logroll.  No focal weakness.  She is neurovascular intact distally.  Specialty Comments:  No specialty comments available.  Imaging: XR Lumbar Spine 2-3 Views  Result Date: 11/15/2019 X-rays demonstrate diffuse spondylosis and DDD with degenerative scoliosis.    PMFS History: Patient Active Problem List   Diagnosis Date Noted  . Depression 05/03/2017  . Type  2 diabetes mellitus without complication, without long-term current use of insulin (HCC) 04/22/2017  . Essential hypertension 04/22/2017  . Language barrier to communication 04/22/2017  . Glucosuria 04/22/2017   Past Medical History:  Diagnosis Date  . Diabetes mellitus without complication (HCC)   . Hypertension     History reviewed. No pertinent family history.  Past Surgical History:  Procedure Laterality Date  . ABDOMINAL HYSTERECTOMY    . CHOLECYSTECTOMY     Social History   Occupational History  . Not on file  Tobacco Use  . Smoking status: Never Smoker  . Smokeless tobacco: Never Used  Substance and Sexual Activity  . Alcohol use:  No    Alcohol/week: 0.0 standard drinks  . Drug use: No  . Sexual activity: Not on file

## 2019-11-16 NOTE — Addendum Note (Signed)
Addended by: Rogers Seeds on: 11/16/2019 01:16 PM   Modules accepted: Orders

## 2019-11-29 ENCOUNTER — Ambulatory Visit: Payer: Medicaid Other | Admitting: Orthopaedic Surgery

## 2019-12-07 ENCOUNTER — Encounter: Payer: Self-pay | Admitting: Nurse Practitioner

## 2019-12-07 ENCOUNTER — Other Ambulatory Visit: Payer: Self-pay | Admitting: Nurse Practitioner

## 2019-12-07 ENCOUNTER — Other Ambulatory Visit: Payer: Self-pay

## 2019-12-07 ENCOUNTER — Ambulatory Visit (INDEPENDENT_AMBULATORY_CARE_PROVIDER_SITE_OTHER): Payer: Medicaid Other | Admitting: Nurse Practitioner

## 2019-12-07 VITALS — BP 125/75 | HR 80 | Ht 59.0 in | Wt 144.4 lb

## 2019-12-07 DIAGNOSIS — E785 Hyperlipidemia, unspecified: Secondary | ICD-10-CM | POA: Diagnosis not present

## 2019-12-07 DIAGNOSIS — M1990 Unspecified osteoarthritis, unspecified site: Secondary | ICD-10-CM

## 2019-12-07 DIAGNOSIS — E119 Type 2 diabetes mellitus without complications: Secondary | ICD-10-CM | POA: Diagnosis not present

## 2019-12-07 DIAGNOSIS — I10 Essential (primary) hypertension: Secondary | ICD-10-CM

## 2019-12-07 DIAGNOSIS — R82998 Other abnormal findings in urine: Secondary | ICD-10-CM

## 2019-12-07 LAB — POCT URINALYSIS DIPSTICK
Bilirubin, UA: NEGATIVE
Glucose, UA: NEGATIVE
Ketones, UA: NEGATIVE
Nitrite, UA: NEGATIVE
Protein, UA: NEGATIVE
Spec Grav, UA: 1.02 (ref 1.010–1.025)
Urobilinogen, UA: 0.2 E.U./dL
pH, UA: 6 (ref 5.0–8.0)

## 2019-12-07 LAB — POCT GLYCOSYLATED HEMOGLOBIN (HGB A1C)
HbA1c POC (<> result, manual entry): 6.9 % (ref 4.0–5.6)
HbA1c, POC (controlled diabetic range): 6.9 % (ref 0.0–7.0)
HbA1c, POC (prediabetic range): 6.9 % — AB (ref 5.7–6.4)
Hemoglobin A1C: 6.9 % — AB (ref 4.0–5.6)

## 2019-12-07 MED ORDER — ENALAPRIL MALEATE 10 MG PO TABS: 10.0000 mg | ORAL_TABLET | Freq: Every day | ORAL | 0 refills | Status: DC

## 2019-12-07 MED ORDER — METFORMIN HCL 500 MG PO TABS: 500.0000 mg | ORAL_TABLET | Freq: Two times a day (BID) | ORAL | 0 refills | Status: DC

## 2019-12-07 MED ORDER — GLYBURIDE 5 MG PO TABS: 5.0000 mg | ORAL_TABLET | Freq: Every day | ORAL | 0 refills | Status: DC

## 2019-12-07 NOTE — Progress Notes (Signed)
[facility] Patient [facility] [address] [place], [place]  [address] Phone:  [phone]   Fax:  [phone]   Established Patient Office Visit  Subjective:  Patient ID: Sara Harper, female    DOB: [birth year]  Age: 74 y.o. MRN: [id number]  CC:  Chief Complaint  Patient presents with  . Follow-up     3 month follow up, dm , achy pain in arms hands , some neck pain     HPI Daissy Yerian presents for  has a past medical history of Diabetes mellitus without complication (HCC) and Hypertension.   Diabetes Mellitus Patient presents for follow up of diabetes. Current symptoms include: paresthesia of the feet. Symptoms have progressed to a point and plateaued. Patient denies foot ulcerations and hypoglycemia . Evaluation to date has included: fasting blood sugar, fasting lipid panel, hemoglobin A1C and microalbuminuria.  Home sugars: BGs consistently in an acceptable range. Current treatment: Continued sulfonylurea which has been effective and Continued metformin which has been effective. She has only been taking Metformin 850mg  based on her previous dose from .    Past Medical History:  Diagnosis Date  . Diabetes mellitus without complication (HCC)   . Hypertension     Past Surgical History:  Procedure Laterality Date  . ABDOMINAL HYSTERECTOMY    . CHOLECYSTECTOMY      History reviewed. No pertinent family history.  Social History   Socioeconomic History  . Marital status: Divorced    Spouse name: Not on file  . Number of children: Not on file  . Years of education: Not on file  . Highest education level: Not on file  Occupational History  . Not on file  Tobacco Use  . Smoking status: Never Smoker  . Smokeless tobacco: Never Used  Substance and Sexual Activity  . Alcohol use: No    Alcohol/week: 0.0 standard drinks  . Drug use: No  . Sexual activity: Not on file  Other Topics Concern  . Not on file  Social History Narrative  . Not on file   Social  Determinants of Health   Financial Resource Strain:   . Difficulty of Paying Living Expenses:   Food Insecurity:   . Worried About [place] in the Last Year:   . Programme researcher, broadcasting/film/video in the Last Year:   Transportation Needs:   . Barista (Medical):   Freight forwarder Lack of Transportation (Non-Medical):   Physical Activity:   . Days of Exercise per Week:   . Minutes of Exercise per Session:   Stress:   . Feeling of Stress :   Social Connections:   . Frequency of Communication with Friends and Family:   . Frequency of Social Gatherings with Friends and Family:   . Attends Religious Services:   . Active Member of Clubs or Organizations:   . Attends [employer] Meetings:   Banker Marital Status:   Intimate Partner Violence:   . Fear of Current or Ex-Partner:   . Emotionally Abused:   [employer] Physically Abused:   . Sexually Abused:     Outpatient Medications Prior to Visit  Medication Sig Dispense Refill  . cetirizine (ZYRTEC ALLERGY) 10 MG tablet Take 1 tablet (10 mg total) by mouth daily. 90 tablet 0  . gabapentin (NEURONTIN) 300 MG capsule Take 1 capsule (300 mg total) by mouth 3 (three) times daily. 270 capsule 0  . Melatonin 10 MG CAPS Take 20 mg by mouth at bedtime as  needed. May start with 10mg  60 capsule 2  . simvastatin (ZOCOR) 10 MG tablet Take 1 tablet (10 mg total) by mouth every evening. 30 tablet 11  . triamcinolone cream (KENALOG) 0.1 % Apply 1 application topically 2 (two) times daily. 456.6 g 0  . enalapril (VASOTEC) 10 MG tablet Take 1 tablet (10 mg total) by mouth daily. 90 tablet 0  . glyBURIDE (DIABETA) 5 MG tablet Take 1 tablet (5 mg total) by mouth daily with breakfast. 90 tablet 0  . metFORMIN (GLUCOPHAGE) 500 MG tablet Take 1 tablet (500 mg total) by mouth 2 (two) times daily with a meal. 180 tablet 0  . meloxicam (MOBIC) 15 MG tablet Take 1 tablet (15 mg total) by mouth daily. (Patient not taking: Reported on 12/07/2019) 30 tablet 2  . methocarbamol  (ROBAXIN) 500 MG tablet Take 1 tablet (500 mg total) by mouth 2 (two) times daily as needed. (Patient not taking: Reported on 12/07/2019) 20 tablet 0  . olopatadine (PATANOL) 0.1 % ophthalmic solution Place 1 drop into both eyes 2 (two) times daily. (Patient not taking: Reported on 12/07/2019) 5 mL 11  . predniSONE (STERAPRED UNI-PAK 21 TAB) 5 MG (21) TBPK tablet Take as directed (Patient not taking: Reported on 12/07/2019) 21 tablet 0   No facility-administered medications prior to visit.    No Known Allergies  ROS Review of Systems  Musculoskeletal: Positive for neck pain.      Objective:    Physical Exam  Constitutional: She is oriented to person, place, and time. She appears well-developed.  HENT:  Head: Normocephalic.  Eyes: Pupils are equal, round, and reactive to light.  Cardiovascular: Normal rate, regular rhythm, normal heart sounds and intact distal pulses.  Pulmonary/Chest: Effort normal.  Musculoskeletal:        General: Normal range of motion.     Cervical back: Normal range of motion.  Neurological: She is alert and oriented to person, place, and time.  Skin: Skin is dry.  Psychiatric: She has a normal mood and affect. Her behavior is normal. Judgment and thought content normal.    BP 125/75 (BP Location: Left Arm)   Pulse 80   Ht 4\' 11"  (1.499 m)   Wt 144 lb 6.4 oz (65.5 kg)   SpO2 96%   BMI 29.17 kg/m  Wt Readings from Last 3 Encounters:  12/07/19 144 lb 6.4 oz (65.5 kg)  10/28/19 144 lb (65.3 kg)  10/24/19 144 lb (65.3 kg)     Health Maintenance Due  Topic Date Due  . Hepatitis C Screening  Never done  . OPHTHALMOLOGY EXAM  Never done  . COLONOSCOPY  Never done  . DEXA SCAN  Never done  . FOOT EXAM  05/21/2018  . PNA vac Low Risk Adult (2 of 2 - PPSV23) 05/21/2018    There are no preventive care reminders to display for this patient.  Lab Results  Component Value Date   TSH 0.519 09/12/2019   Lab Results  Component Value Date   WBC 5.7  09/12/2019   HGB 13.1 09/12/2019   HCT 37.8 09/12/2019   MCV 93 09/12/2019   PLT 252 09/12/2019   Lab Results  Component Value Date   NA 137 09/12/2019   K 4.3 09/12/2019   CO2 25 04/20/2017   GLUCOSE 131 (H) 09/12/2019   BUN 19 09/12/2019   CREATININE 0.79 09/12/2019   BILITOT <0.2 09/12/2019   ALKPHOS 48 09/12/2019   AST 16 09/12/2019   PROT 6.5 09/12/2019  ALBUMIN 4.6 09/12/2019   CALCIUM 9.5 09/12/2019   Lab Results  Component Value Date   CHOL 213 (H) 09/13/2019   Lab Results  Component Value Date   HDL 46 09/13/2019   Lab Results  Component Value Date   LDLCALC 137 (H) 09/13/2019   Lab Results  Component Value Date   TRIG 169 (H) 09/13/2019   Lab Results  Component Value Date   CHOLHDL 4.6 (H) 09/13/2019   Lab Results  Component Value Date   HGBA1C 6.9 (A) 12/07/2019   HGBA1C 6.9 12/07/2019   HGBA1C 6.9 (A) 12/07/2019   HGBA1C 6.9 12/07/2019      Assessment & Plan:   Problem List Items Addressed This Visit      High   Essential hypertension   Relevant Medications   enalapril (VASOTEC) 10 MG tablet   Type 2 diabetes mellitus without complication, without long-term current use of insulin (HCC) - Primary   Relevant Medications   enalapril (VASOTEC) 10 MG tablet   glyBURIDE (DIABETA) 5 MG tablet   metFORMIN (GLUCOPHAGE) 500 MG tablet   Other Relevant Orders   POCT Urinalysis Dipstick (Completed)   HgB A1c (Completed)    Other Visit Diagnoses    Dyslipidemia       Neck pain       to be evaluated at Ortho      Meds ordered this encounter  Medications  . enalapril (VASOTEC) 10 MG tablet    Sig: Take 1 tablet (10 mg total) by mouth daily.    Dispense:  90 tablet    Refill:  0    Order Specific Question:   Supervising Provider    Answer:   Tresa Garter [id number]  . glyBURIDE (DIABETA) 5 MG tablet    Sig: Take 1 tablet (5 mg total) by mouth daily with breakfast.    Dispense:  90 tablet    Refill:  0    Order Specific Question:    Supervising Provider    Answer:   Tresa Garter [id number]  . metFORMIN (GLUCOPHAGE) 500 MG tablet    Sig: Take 1 tablet (500 mg total) by mouth 2 (two) times daily with a meal.    Dispense:  180 tablet    Refill:  0    Order Specific Question:   Supervising Provider    Answer:   Tresa Garter [[id number]]    Follow-up: Return in about 3 months (around 03/08/2020) for AND fasting labs on that day am apt thanks.    Vevelyn Francois, NP

## 2019-12-07 NOTE — Patient Instructions (Signed)
O diabetes mellitus e a nutrio, adultos Diabetes Mellitus and Nutrition, Adult Quando voc sofre de diabetes (diabetes mellitus),  muito importante ter hbitos de alimentao saudveis, uma vez que os seus nveis de acar no sangue (glicose) so significativamente afetados pelo que voc come e bebe. Comer alimentos saudveis nas quantidades apropriadas e aproximadamente nos mesmos horrios todos os dias pode ajudar voc a:  Controlar seu nvel de glicose sangunea.  Reduzir seu risco de doena cardaca.  Melhorar sua presso arterial.  Alcanar ou manter um peso saudvel. Todas as pessoas com diabetes so diferentes, e cada pessoa tem diferentes necessidades em termos de um plano de refeies. Seu mdico poder recomendar que voc colabore com um especialista em nutrio e dieta (nutricionista) para elaborar o melhor plano de refeies para voc. Seu plano de refeies poder variar dependendo de fatores como:  As calorias de que voc necessita.  Os medicamentos que toma.  Seu peso.  Seus nveis de glicose sangunea, presso arterial e colesterol.  Seu nvel de atividade.  Outros quadros clnicos, como doena cardaca ou renal. Como os carboidratos me afetam? Os carboidratos, tambm chamado de acares, afetam o nvel de sua glicose sangunea mais do que qualquer outro tipo de alimento. A ingesto de carboidratos aumenta naturalmente a quantidade de glicose no seu sangue. A contagem de carboidratos  um mtodo para acompanhar a quantidade de carboidratos que voc ingere. A contagem de carboidratos  importante para manter sua glicose sangunea em um nvel saudvel, principalmente se voc usa insulina ou toma certos medicamentos orais para o diabetes.  importante saber quanto de carboidrato voc pode ingerir com segurana em cada refeio. Isso varia de pessoa para pessoa. Seu nutricionista poder ajud-lo a calcular quanto de carboidrato voc deve consumir em cada refeio e em cada  lanche. Alimentos que contm carboidratos incluem:  Po, cereais, arroz, massas e bolachas.  Batatas e milho.  Ervilhas, feijo e lentilhas.  Leite e iogurte.  Frutas e sucos.  Sobremesas, como bolos, biscoitos, sorvete e doces. Como o lcool me afeta? O lcool pode causar uma sbita reduo do nvel de glicose sangunea (hipoglicemia), especialmente se voc usar insulina ou tomar certos medicamentos orais para o diabetes. A hipoglicemia pode ser um quadro clnico potencialmente fatal. Os sintomas de hipoglicemia (sonolncia, vertigem e confuso) so parecidos com os sintomas da ingesto abusiva de lcool. Caso seu mdico diga que o lcool  seguro para voc, siga essas orientaes:  Limite o consumo de bebidas alcolicas a, no mximo, 1 dose por dia para mulheres no grvidas e 2 doses por dia para homens. Uma dose equivale a 12 onas de cerveja, 5 onas de vinho ou 1 ona de bebida destilada.  No beba de estmago vazio.  Mantenha sua hidratao com gua, refrigerante diet ou ch gelado sem acar.  Tenha em mente que o refrigerante normal, suco e outras bebidas podem conter muito acar e devem ser contadas como carboidratos. Quais so as dicas para seguir este plano?  Leia os rtulos dos alimentos  Comece verificando o tamanho da poro nas "Informaes nutricionais" no rtulo de alimentos e bebidas embalados. A quantidade de calorias, carboidratos, gorduras e outros nutrientes listados no rtulo se baseia em uma poro padro do alimento. Muitos itens contm mais de uma poro por embalagem.  Verifique o total de gramas (g) de carboidratos contidos em uma poro. Voc pode calcular o nmero de pores de carboidratos em uma poro dividindo o total de carboidratos por 15. Por exemplo: supondo que um alimento contenha um   total de 30 g de carboidratos, isso seria igual a 2 pores de carboidratos.  Verifique o nmero de gramas (g) de gorduras saturadas e trans em uma poro.  Escolha alimentos com pouca ou nenhuma quantidade dessas gorduras.  Verifique a quantidade de miligramas (mg) de sal (sdio) em uma poro. A maioria das pessoas deve limitar o consumo de sdio a 2.300 mg por dia.  Sempre verifique as informaes nutricionais dos alimentos rotulados como de "baixo teor de gordura" e "gordura zero". Esses alimentos podem conter elevado teor de acar de adio ou carboidratos refinados e devem ser evitados.  Converse com seu nutricionista para identificar suas metas dirias dos nutrientes listados na tabela. No mercado  Evite comprar alimentos enlatados, semiprontos ou processados. Esses alimentos tendem a conter elevado teor de gordura, sdio e acares adicionados.  Escolha itens das reas mais externas da seo de alimentos. Ela inclui frutas e verduras frescas, cereais a granel, carnes frescas e produtos avirios frescos. Na cozinha  Use mtodos de cozimento de baixo calor, como assar, em vez de mtodos de cozimento de alto calor, como fritura por imerso.  Cozinhe com leos saudveis para o corao, como de oliva, canola ou girassol.  Evite cozinhar com manteiga, creme ou carnes com elevado teor de gordura. O planejamento das refeies  Consuma refeies e lanches regularmente, de preferncia nos mesmos horrios todos os dias. Evite ficar longos perodos sem comer.  Coma alimentos ricos em fibra, como frutas e verduras frescas, feijo e gros integrais. Converse com seu nutricionista sobre quantas pores de carboidratos voc pode comer em cada refeio.  Coma de 4-6 onas de protena magra por dia, como carne magra, frango, peixe, ovos ou tofu. Uma ona de protena magra  igual a: ? 1 ona de carne, frango ou peixe. ? 1 ovo. ?  xcara de tofu.  Coma alguns alimentos todos os dias que contenham gorduras saudveis, como abacate, nozes, sementes e peixe. Estilo de vida  Verifique sua glicose sangunea regularmente.  Exercite-se regularmente de  acordo com as indicaes do seu mdico. Isso pode incluir: ? 150 minutos de exerccios de intensidade moderada ou de intensidade vigorosa por semana. Pode ser uma caminhada leve, andar de bicicleta ou fazer hidroginstica. ? Alongar e realizar exerccios de fora, como ioga ou musculao, pelo menos 2 vezes por semana.  Tome medicamentos somente de acordo com as orientaes do seu mdico.  No use nenhum produto que contenha nicotina ou tabaco, como cigarros tradicionais e cigarros eletrnicos. Caso precise de ajuda para parar de fumar, fale com seu mdico.  Consulte-se com um especialista em diabetes para identificar estratgias para lidar com o estresse e quaisquer desafios emocionais ou sociais. Perguntas a fazer ao mdico  Preciso me consultar com um especialista em diabetes?  Preciso me consultar com um nutricionista?  Para que nmero devo ligar se tiver dvidas?  Quais so os melhores horrios para verificar minha glicose sangunea? Onde conseguir mais informaes:  Associao de Diabetes Americana (American Diabetes Association, ADA): diabetes.org  Academia de Nutrio e Diettica (Academy of Nutrition and Dietetics): www.eatright.org  National Institute of Diabetes and Digestive and Kidney Diseases (Instituto Nacional de Diabetes e Doenas Digestivas e Renais): www.niddk.nih.gov Resumo  Um plano de refeies saudveis ajudar voc a controlar sua glicose sangunea e a manter um estilo de vida saudvel.  Consultar um especialista em nutrio (nutricionista) poder ajudar na elaborao do melhor plano para voc.  Tenha em mente que carboidratos (acares) e lcool tm efeitos imediatos sobre seus nveis de glicose   sangunea.  importante contar os carboidratos e consumir lcool com cautela. Estas informaes no se destinam a substituir as recomendaes de seu mdico. No deixe de discutir quaisquer dvidas com seu mdico. Document Revised: 03/17/2017 Document Reviewed:  11/06/2016 Elsevier Patient Education  2020 Elsevier Inc.  

## 2019-12-12 ENCOUNTER — Ambulatory Visit: Payer: No Typology Code available for payment source | Admitting: Nurse Practitioner

## 2019-12-15 ENCOUNTER — Ambulatory Visit: Payer: Self-pay

## 2019-12-15 ENCOUNTER — Other Ambulatory Visit: Payer: Self-pay

## 2019-12-15 ENCOUNTER — Ambulatory Visit (INDEPENDENT_AMBULATORY_CARE_PROVIDER_SITE_OTHER): Payer: Medicaid Other | Admitting: Physical Medicine and Rehabilitation

## 2019-12-15 ENCOUNTER — Encounter: Payer: Self-pay | Admitting: Physical Medicine and Rehabilitation

## 2019-12-15 VITALS — BP 134/81 | HR 77

## 2019-12-15 DIAGNOSIS — G8929 Other chronic pain: Secondary | ICD-10-CM

## 2019-12-15 DIAGNOSIS — M5416 Radiculopathy, lumbar region: Secondary | ICD-10-CM | POA: Diagnosis not present

## 2019-12-15 DIAGNOSIS — M5116 Intervertebral disc disorders with radiculopathy, lumbar region: Secondary | ICD-10-CM

## 2019-12-15 DIAGNOSIS — M48062 Spinal stenosis, lumbar region with neurogenic claudication: Secondary | ICD-10-CM

## 2019-12-15 DIAGNOSIS — M5441 Lumbago with sciatica, right side: Secondary | ICD-10-CM

## 2019-12-15 MED ORDER — METHYLPREDNISOLONE ACETATE 80 MG/ML IJ SUSP
40.0000 mg | Freq: Once | INTRAMUSCULAR | Status: AC
  Administered 2019-12-15: 40 mg

## 2019-12-15 NOTE — Progress Notes (Signed)
Lower back and right thigh pain. Sometimes in left leg. Knee pain. Pain is "all over the place right now."  Worse with walking. Better with sitting. Numeric Pain Rating Scale and Functional Assessment Average Pain 8   In the last MONTH (on 0-10 scale) has pain interfered with the following?  1. General activity like being  able to carry out your everyday physical activities such as walking, climbing stairs, carrying groceries, or moving a chair?  Rating(9)   +Driver, -BT, -Dye Allergies.

## 2019-12-16 ENCOUNTER — Encounter: Payer: Self-pay | Admitting: Physical Medicine and Rehabilitation

## 2019-12-16 NOTE — Procedures (Signed)
Lumbosacral Transforaminal Epidural Steroid Injection - Sub-Pedicular Approach with Fluoroscopic Guidance  Patient: Sara Harper      Date of Birth: Jun 07, 1946 MRN: [id number] PCP: Massie Maroon, FNP      Visit Date: 12/15/2019   Universal Protocol:    Date/Time: 12/15/2019  Consent Given By: the patient  Position: PRONE  Additional Comments: Vital signs were monitored before and after the procedure. Patient was prepped and draped in the usual sterile fashion. The correct patient, procedure, and site was verified.   Injection Procedure Details:  Procedure Site One Meds Administered:  Meds ordered this encounter  Medications  . methylPREDNISolone acetate (DEPO-MEDROL) injection 40 mg    Laterality: Right  Location/Site:  L3-L4  Needle size: 22 G  Needle type: Spinal  Needle Placement: Transforaminal  Findings:    -Comments: Excellent flow of contrast along the nerve and into the epidural space.  Procedure Details: After squaring off the end-plates to get a true AP view, the C-arm was positioned so that an oblique view of the foramen as noted above was visualized. The target area is just inferior to the "nose of the scotty dog" or sub pedicular. The soft tissues overlying this structure were infiltrated with 2-3 ml. of 1% Lidocaine without Epinephrine.  The spinal needle was inserted toward the target using a "trajectory" view along the fluoroscope beam.  Under AP and lateral visualization, the needle was advanced so it did not puncture dura and was located close the 6 O'Clock position of the pedical in AP tracterory. Biplanar projections were used to confirm position. Aspiration was confirmed to be negative for CSF and/or blood. A 1-2 ml. volume of Isovue-250 was injected and flow of contrast was noted at each level. Radiographs were obtained for documentation purposes.   After attaining the desired flow of contrast documented above, a 0.5 to 1.0 ml test dose of  0.25% Marcaine was injected into each respective transforaminal space.  The patient was observed for 90 seconds post injection.  After no sensory deficits were reported, and normal lower extremity motor function was noted,   the above injectate was administered so that equal amounts of the injectate were placed at each foramen (level) into the transforaminal epidural space.   Additional Comments:  The patient tolerated the procedure well Dressing: 2 x 2 sterile gauze and Band-Aid    Post-procedure details: Patient was observed during the procedure. Post-procedure instructions were reviewed.  Patient left the clinic in stable condition.

## 2019-12-16 NOTE — Progress Notes (Signed)
Sara Harper - 74 y.o. female MRN [id number]  Date of birth: 1945-08-14  Office Visit Note: Visit Date: 12/15/2019 PCP: Dorena Dew, FNP Referred by: Dorena Dew, FNP  Subjective: Chief Complaint  Patient presents with   Lower Back - Pain   HPI: Sara Harper is a 74 y.o. female who comes in today At the request of Dr. Arlyss Gandy evaluation management of chronic worsening exacerbation of chronic low back pain with right radicular leg pain.  Interpreter is present today making the evaluation somewhat more complicated.  Notes from her primary care provider recently Lanell Persons, NP were reviewed as well as notes from [facility] sports medicine,  Georges Lynch, MD and Dr. Erlinda Hong.  She has been having worsening low back pain for many years but in particular 2018 she actually was seen in the emergency department.  She tells me at that point she had significantly increased pain and it was going into the left leg.  She did get some relief over that time with the use of anti-inflammatories and through the [facility] sports medicine center did have MRI of the lumbar spine which is also reviewed below and reviewed with the patient.  She was placed on gabapentin and other anti-inflammatory and at least at times has gotten some better off and on.  More recently exacerbation into the right hip and leg.  But was also having knee pain.  She rates her pain as an 8 out of 10.  It is worse with standing and walking better with sitting.  She has had no focal weakness.  Her case is complicated by type 2 diabetes but without a known history of polyneuropathy.  She reports having had injections in [place] as far back as 2016 or 2017.  She said he had a series of 3 injections that seem to help for a while.  Dr. Erlinda Hong started her on prednisone for recent exacerbation that did not really help that much.  Muscle relaxer was also given.  She has had continue home exercises through the sports medicine center and that has not  helped either.  Review of Systems  Musculoskeletal: Positive for back pain and joint pain.  All other systems reviewed and are negative.  Otherwise per HPI.  Assessment & Plan: Visit Diagnoses:  1. Lumbar radiculopathy   2. Spinal stenosis of lumbar region with neurogenic claudication   3. Radiculopathy due to lumbar intervertebral disc disorder   4. Chronic bilateral low back pain with right-sided sciatica     Plan: Findings:  Chronic history of low back pain with off-and-on hip and leg pain initially more left now more right over the last several months.  Fairly severe and limiting what she can do.  This represents an exacerbation of her chronic condition.  Failure of conservative care through several physicians at this point no history of back surgery.  MRI shows multifactorial stenosis at L3-4 with right more than left facet arthropathy at L4-5 and foraminal narrowing at L4.  Her symptoms seem to be more consistent with an L3 and L4 radiculopathy consistent with the stenosis.  No high-grade nerve compression.  At this point I think the best approach is a right L3 transforaminal epidural steroid injection which we can do today for her because the amount of pain she is having.  We will consider repeat injection depending on how well she does versus changing the level to L4 for the foraminal narrowing.  She should continue on her current  medications particularly gabapentin.  She could ultimately be considered a surgical candidate for lumbar decompression.  No listhesis noted.    Meds & Orders:  Meds ordered this encounter  Medications   methylPREDNISolone acetate (DEPO-MEDROL) injection 40 mg    Orders Placed This Encounter  Procedures   XR C-ARM NO REPORT   Epidural Steroid injection    Follow-up: Return in about 4 weeks (around 01/12/2020) for ? repeat vs another level.   Procedures: No procedures performed  Lumbosacral Transforaminal Epidural Steroid Injection - Sub-Pedicular  Approach with Fluoroscopic Guidance  Patient: Sara Harper      Date of Birth: 07-13-1946 MRN: [id number] PCP: Massie Maroon, FNP      Visit Date: 12/15/2019   Universal Protocol:    Date/Time: 12/15/2019  Consent Given By: the patient  Position: PRONE  Additional Comments: Vital signs were monitored before and after the procedure. Patient was prepped and draped in the usual sterile fashion. The correct patient, procedure, and site was verified.   Injection Procedure Details:  Procedure Site One Meds Administered:  Meds ordered this encounter  Medications   methylPREDNISolone acetate (DEPO-MEDROL) injection 40 mg    Laterality: Right  Location/Site:  L3-L4  Needle size: 22 G  Needle type: Spinal  Needle Placement: Transforaminal  Findings:    -Comments: Excellent flow of contrast along the nerve and into the epidural space.  Procedure Details: After squaring off the end-plates to get a true AP view, the C-arm was positioned so that an oblique view of the foramen as noted above was visualized. The target area is just inferior to the "nose of the scotty dog" or sub pedicular. The soft tissues overlying this structure were infiltrated with 2-3 ml. of 1% Lidocaine without Epinephrine.  The spinal needle was inserted toward the target using a "trajectory" view along the fluoroscope beam.  Under AP and lateral visualization, the needle was advanced so it did not puncture dura and was located close the 6 O'Clock position of the pedical in AP tracterory. Biplanar projections were used to confirm position. Aspiration was confirmed to be negative for CSF and/or blood. A 1-2 ml. volume of Isovue-250 was injected and flow of contrast was noted at each level. Radiographs were obtained for documentation purposes.   After attaining the desired flow of contrast documented above, a 0.5 to 1.0 ml test dose of 0.25% Marcaine was injected into each respective transforaminal space.   The patient was observed for 90 seconds post injection.  After no sensory deficits were reported, and normal lower extremity motor function was noted,   the above injectate was administered so that equal amounts of the injectate were placed at each foramen (level) into the transforaminal epidural space.   Additional Comments:  The patient tolerated the procedure well Dressing: 2 x 2 sterile gauze and Band-Aid    Post-procedure details: Patient was observed during the procedure. Post-procedure instructions were reviewed.  Patient left the clinic in stable condition.     Clinical History: MRI LUMBAR SPINE WITHOUT CONTRAST  TECHNIQUE: Multiplanar, multisequence MR imaging of the lumbar spine was performed. No intravenous contrast was administered.  COMPARISON:  Lumbar spine radiograph 05/01/2017  FINDINGS: Segmentation:  Standard.  Alignment: Grade 1 L3-L4 anterolisthesis and grade 1 L4-L5 retrolisthesis. Mild S-shaped curvature of the lumbar spine.  Vertebrae: Multilevel discogenic signal changes, greatest at L1-L2. No acute abnormality.  Conus medullaris: Extends to the L1 level and appears normal.  Paraspinal and other soft tissues: 1.5 cm right adrenal  nodule.  Disc levels:  T11-T12:  Small disc bulge with mild spinal canal stenosis.  T12-L1: Normal disc space and facets. No spinal canal or neuroforaminal stenosis.  L1-L2: Small diffuse disc bulge.  No stenosis.  L2-L3: Diffuse disc bulge, left eccentric. Narrowing of the left lateral recess. No spinal canal stenosis. Moderate left neural foraminal stenosis.  L3-L4: Diffuse disc bulge, slightly left eccentric. There is severe spinal canal stenosis and bilateral lateral recess stenosis, left worse than right. The right neural foramen remains patent. There is severe left neural foraminal stenosis with nerve root impingement.  L4-L5: Right-greater-than-left facet hypertrophy and a right eccentric  disc bulge. Severe right neural foraminal stenosis and neural impingement. No central spinal canal or left neural foraminal stenosis.  L5-S1: Small central disc protrusion superimposed on diffuse disc bulge. Severe right and moderate left neural foraminal stenosis. No spinal canal stenosis.  Visualized sacrum: Normal.  IMPRESSION: 1. Severe spinal canal stenosis at L3-L4 with bilateral lateral recess stenosis and severe left neural foraminal stenosis. 2. Severe right, moderate left L5-S1 neural foraminal stenosis. 3. Severe right L4-L5 neural foraminal stenosis. 4. Moderate left L2-L3 neural foraminal stenosis.  Electronically Signed: By: Deatra Robinson M.D. On: 06/03/2017 17:29   She reports that she has never smoked. She has never used smokeless tobacco.  Recent Labs    12/07/19 1533  HGBA1C 6.9*   6.9   6.9   6.9*    Objective:  VS:  HT:     WT:    BMI:      BP:134/81   HR:77bpm   TEMP: ( )   RESP:  Physical Exam Vitals and nursing note reviewed.  Constitutional:      General: She is not in acute distress.    Appearance: Normal appearance. She is well-developed. She is not ill-appearing.  HENT:     Head: Normocephalic and atraumatic.  Eyes:     Conjunctiva/sclera: Conjunctivae normal.     Pupils: Pupils are equal, round, and reactive to light.  Cardiovascular:     Rate and Rhythm: Normal rate.     Pulses: Normal pulses.  Pulmonary:     Effort: Pulmonary effort is normal.  Musculoskeletal:     Right lower leg: No edema.     Left lower leg: No edema.     Comments: Patient slow rise from a seated position to full extension does have concordant pain with facet loading.  No focal trigger points noted some minor pain over the left greater trochanter and right greater trochanter.  No pain with hip rotation good distal strength.  Skin:    General: Skin is warm and dry.     Findings: No erythema or rash.  Neurological:     General: No focal deficit present.      Mental Status: She is alert and oriented to person, place, and time.     Sensory: No sensory deficit.     Motor: No abnormal muscle tone.     Coordination: Coordination normal.     Gait: Gait normal.  Psychiatric:        Mood and Affect: Mood normal.        Behavior: Behavior normal.     Ortho Exam  Imaging: XR C-ARM NO REPORT  Result Date: 12/15/2019 Please see Notes tab for imaging impression.   Past Medical/Family/Surgical/Social History: Medications & Allergies reviewed per EMR, new medications updated. Patient Active Problem List   Diagnosis Date Noted   Depression 05/03/2017   Type 2  diabetes mellitus without complication, without long-term current use of insulin (HCC) 04/22/2017   Essential hypertension 04/22/2017   Language barrier to communication 04/22/2017   Glucosuria 04/22/2017   Past Medical History:  Diagnosis Date   Diabetes mellitus without complication (HCC)    Hypertension    History reviewed. No pertinent family history. Past Surgical History:  Procedure Laterality Date   ABDOMINAL HYSTERECTOMY     CHOLECYSTECTOMY     Social History   Occupational History   Not on file  Tobacco Use   Smoking status: Never Smoker   Smokeless tobacco: Never Used  Substance and Sexual Activity   Alcohol use: No    Alcohol/week: 0.0 standard drinks   Drug use: No   Sexual activity: Not on file

## 2019-12-22 ENCOUNTER — Emergency Department (INDEPENDENT_AMBULATORY_CARE_PROVIDER_SITE_OTHER)
Admission: EM | Admit: 2019-12-22 | Discharge: 2019-12-22 | Disposition: A | Discharge status: Home / Self Care | Payer: Medicaid Other | Source: Home / Self Care

## 2019-12-22 ENCOUNTER — Other Ambulatory Visit: Payer: Self-pay

## 2019-12-22 ENCOUNTER — Emergency Department (INDEPENDENT_AMBULATORY_CARE_PROVIDER_SITE_OTHER): Payer: Medicaid Other

## 2019-12-22 DIAGNOSIS — M19111 Post-traumatic osteoarthritis, right shoulder: Secondary | ICD-10-CM

## 2019-12-22 DIAGNOSIS — M20011 Mallet finger of right finger(s): Secondary | ICD-10-CM | POA: Diagnosis not present

## 2019-12-22 DIAGNOSIS — M19041 Primary osteoarthritis, right hand: Secondary | ICD-10-CM

## 2019-12-22 DIAGNOSIS — M79641 Pain in right hand: Secondary | ICD-10-CM

## 2019-12-22 MED ORDER — PREDNISONE 20 MG PO TABS: ORAL_TABLET | ORAL | 0 refills | Status: DC

## 2019-12-22 MED ORDER — CYCLOBENZAPRINE HCL 5 MG PO TABS: 5.0000 mg | ORAL_TABLET | Freq: Two times a day (BID) | ORAL | 0 refills | Status: DC | PRN

## 2019-12-22 NOTE — ED Triage Notes (Signed)
Patient presents to Urgent Care with complaints of right hand and right shoulder pain since about a month ago, progressively worsening. Patient reports she has pain where the "balls" are. fingers appear to have arthritis in them. Family translating per pt request.  Pt has taken medication for the pain that she got from [place], states it is like advil or tylenol.

## 2019-12-22 NOTE — Discharge Instructions (Signed)
  Please take medication as prescribed and schedule a follow up appointment with your primary care provider and sports medicine or a previously established orthopedist for ongoing management of your severe arthritis.

## 2019-12-22 NOTE — ED Provider Notes (Signed)
[facility]    CSN: [id number] Arrival date & time: 12/22/19  1529      History   Chief Complaint Chief Complaint  Patient presents with  . Hand Pain  . Shoulder Pain    HPI Sara Harper is a 74 y.o. female.   Language interpreter used.  HPI  Sara Harper is a 74 y.o. female presenting to UC with c/o 1 month of worsening Right shoulder and hand pain. Pain is aching and sore, swelling to fingers in her hand. No recent injury or repetitive movements. She has taken medication from [place], similar to Advil or Tylenol w/o relief. Family member accompanying pt states she has and appointment with PCP next week for same but pain was so bad today she did not want to wait. Pt does report injury to her Right shoulder from a fall about 2 years ago but has not had problems until recently.  She is Right hand dominant.    Past Medical History:  Diagnosis Date  . Diabetes mellitus without complication (HCC)   . Hypertension     Patient Active Problem List   Diagnosis Date Noted  . Depression 05/03/2017  . Type 2 diabetes mellitus without complication, without long-term current use of insulin (HCC) 04/22/2017  . Essential hypertension 04/22/2017  . Language barrier to communication 04/22/2017  . Glucosuria 04/22/2017    Past Surgical History:  Procedure Laterality Date  . ABDOMINAL HYSTERECTOMY    . CHOLECYSTECTOMY      OB History   No obstetric history on file.      Home Medications    Prior to Admission medications   Medication Sig Start Date End Date Taking? Authorizing Provider  cetirizine (ZYRTEC ALLERGY) 10 MG tablet Take 1 tablet (10 mg total) by mouth daily. 10/28/19   Wallis Bamberg, PA-C  cyclobenzaprine (FLEXERIL) 5 MG tablet Take 1 tablet (5 mg total) by mouth 2 (two) times daily as needed for muscle spasms. 12/22/19   Lurene Shadow, PA-C  enalapril (VASOTEC) 10 MG tablet Take 1 tablet (10 mg total) by mouth daily. 12/07/19 03/06/20  Barbette Merino, NP   gabapentin (NEURONTIN) 300 MG capsule Take 1 capsule (300 mg total) by mouth 3 (three) times daily. 10/06/19 01/04/20  Barbette Merino, NP  glyBURIDE (DIABETA) 5 MG tablet Take 1 tablet (5 mg total) by mouth daily with breakfast. 12/07/19 03/06/20  Barbette Merino, NP  Melatonin 10 MG CAPS Take 20 mg by mouth at bedtime as needed. May start with 10mg  10/06/19   10/08/19, NP  metFORMIN (GLUCOPHAGE) 500 MG tablet Take 1 tablet (500 mg total) by mouth 2 (two) times daily with a meal. 12/07/19 03/06/20  03/08/20, NP  predniSONE (DELTASONE) 20 MG tablet 3 tabs po day one, then 2 po daily x 4 days 12/22/19   02/21/20, PA-C  simvastatin (ZOCOR) 10 MG tablet Take 1 tablet (10 mg total) by mouth every evening. 09/14/19 09/13/20  09/15/20, NP  triamcinolone cream (KENALOG) 0.1 % Apply 1 application topically 2 (two) times daily. 10/24/19   12/24/19, NP    Family History Family History  Problem Relation Age of Onset  . Healthy Mother   . Healthy Father     Social History Social History   Tobacco Use  . Smoking status: Never Smoker  . Smokeless tobacco: Never Used  Substance Use Topics  . Alcohol use: No    Alcohol/week: 0.0 standard drinks  .  Drug use: No     Allergies   Patient has no known allergies.   Review of Systems Review of Systems  Musculoskeletal: Positive for arthralgias, joint swelling and myalgias.  Skin: Negative for color change and wound.  Neurological: Negative for weakness and numbness.     Physical Exam Triage Vital Signs ED Triage Vitals  Enc Vitals Group     BP 12/22/19 1547 135/76     Pulse Rate 12/22/19 1547 75     Resp 12/22/19 1547 16     Temp 12/22/19 1547 99.2 F (37.3 C)     Temp Source 12/22/19 1547 Oral     SpO2 12/22/19 1547 97 %     Weight --      Height --      Head Circumference --      Peak Flow --      Pain Score 12/22/19 1546 8     Pain Loc --      Pain Edu? --      Excl. in [place]? --    No data found.   Updated Vital Signs BP 135/76 (BP Location: Left Arm)   Pulse 75   Temp 99.2 F (37.3 C) (Oral)   Resp 16   SpO2 97%   Visual Acuity Right Eye Distance:   Left Eye Distance:   Bilateral Distance:    Right Eye Near:   Left Eye Near:    Bilateral Near:     Physical Exam Vitals and nursing note reviewed.  Constitutional:      Appearance: Normal appearance. She is well-developed.  HENT:     Head: Normocephalic and atraumatic.  Cardiovascular:     Rate and Rhythm: Normal rate and regular rhythm.     Pulses:          Radial pulses are 2+ on the right side.  Pulmonary:     Effort: Pulmonary effort is normal.  Musculoskeletal:        General: Tenderness and deformity (right hand- mallot deformity of middle finger) present. Normal range of motion.     Cervical back: Normal range of motion and neck supple. No rigidity or tenderness.     Comments: Right shoulder: no obvious deformity. Full ROM, diffuse tenderness including muscular tenderness to Right upper back. No spinal tenderness. Right hand: diffuse tenderness, worse in Right middle finger.   Skin:    General: Skin is warm and dry.     Capillary Refill: Capillary refill takes less than 2 seconds.     Findings: No bruising or erythema.  Neurological:     Mental Status: She is alert and oriented to person, place, and time.     Sensory: No sensory deficit.  Psychiatric:        Behavior: Behavior normal.      UC Treatments / Results  Labs (all labs ordered are listed, but only abnormal results are displayed) Labs Reviewed - No data to display  EKG   Radiology DG Shoulder Right  Result Date: 12/22/2019 CLINICAL DATA:  Right shoulder pain EXAM: RIGHT SHOULDER - 2+ VIEW COMPARISON:  None. FINDINGS: Frontal, transscapular, and axillary views of the right shoulder are obtained. No fracture, subluxation, or dislocation. There are marked hypertrophic changes of the acromioclavicular joint, with spurring of the undersurface of  the acromion process. Significant narrowing of the acromial humeral interval suggests chronic rotator cuff tear. There is mild glenohumeral osteoarthritis. Right chest is clear. IMPRESSION: 1. Significant osteoarthritis of the right shoulder, with likely  chronic rotator cuff tear. 2. No acute fracture. Electronically Signed   By: Sharlet Salina M.D.   On: 12/22/2019 16:41   DG Hand Complete Right  Result Date: 12/22/2019 CLINICAL DATA:  RIGHT hand pain worsened for months EXAM: RIGHT HAND - COMPLETE 3+ VIEW COMPARISON:  None FINDINGS: Osseous demineralization. Diffuse degenerative changes of IP joints most severe at DIP joints of index and middle fingers. Mild soft tissue swelling and angular deformity at the DIP joints of the index and middle fingers. Additional joint space narrowing at first [facility] joint. No acute fracture or dislocation. IMPRESSION: Osseous demineralization with scattered degenerative changes of IP joints throughout RIGHT hand. Deformities and soft tissue swelling at the DIP joints of the index and middle fingers, consistent with osteoarthritis. Electronically Signed   By: Ulyses Southward M.D.   On: 12/22/2019 16:42    Procedures Procedures (including critical care time)  Medications Ordered in UC Medications - No data to display  Initial Impression / Assessment and Plan / UC Course  I have reviewed the triage vital signs and the nursing notes.  Pertinent labs & imaging results that were available during my care of the patient were reviewed by me and considered in my medical decision making (see chart for details).     Discussed imaging with pt Will start pt on prednisone and flexeril Encouraged f/u with Sports Medicine or an Orthopedist for chronic pain due to her severe arthritis. AVs provided   Final Clinical Impressions(s) / UC Diagnoses   Final diagnoses:  Right hand pain  Mallet deformity of right middle finger  Post-traumatic osteoarthritis of right shoulder   Osteoarthritis of right hand, unspecified osteoarthritis type     Discharge Instructions      Please take medication as prescribed and schedule a follow up appointment with your primary care provider and sports medicine or a previously established orthopedist for ongoing management of your severe arthritis.     ED Prescriptions    Medication Sig Dispense Auth. Provider   predniSONE (DELTASONE) 20 MG tablet  (Status: Discontinued) 3 tabs po daily x 3 days, then 2 tabs x 3 days, then 1.5 tabs x 3 days, then 1 tab x 3 days, then 0.5 tabs x 3 days 27 tablet Phelps, Erin O, PA-C   predniSONE (DELTASONE) 20 MG tablet 3 tabs po day one, then 2 po daily x 4 days 11 tablet Phelps, Erin O, PA-C   cyclobenzaprine (FLEXERIL) 5 MG tablet Take 1 tablet (5 mg total) by mouth 2 (two) times daily as needed for muscle spasms. 10 tablet Lurene Shadow, PA-C     I have reviewed the PDMP during this encounter.   Lurene Shadow, PA-C 12/23/19 1027

## 2019-12-27 ENCOUNTER — Encounter: Payer: Self-pay | Admitting: Sports Medicine

## 2019-12-27 ENCOUNTER — Encounter: Payer: Self-pay | Admitting: Emergency Medicine

## 2019-12-27 ENCOUNTER — Other Ambulatory Visit: Payer: Self-pay

## 2019-12-27 ENCOUNTER — Emergency Department (INDEPENDENT_AMBULATORY_CARE_PROVIDER_SITE_OTHER)
Admission: EM | Admit: 2019-12-27 | Discharge: 2019-12-27 | Disposition: A | Discharge status: Home / Self Care | Payer: Medicaid Other | Source: Home / Self Care

## 2019-12-27 ENCOUNTER — Ambulatory Visit (INDEPENDENT_AMBULATORY_CARE_PROVIDER_SITE_OTHER): Payer: Medicaid Other | Admitting: Sports Medicine

## 2019-12-27 DIAGNOSIS — M47816 Spondylosis without myelopathy or radiculopathy, lumbar region: Secondary | ICD-10-CM

## 2019-12-27 DIAGNOSIS — G8929 Other chronic pain: Secondary | ICD-10-CM | POA: Diagnosis not present

## 2019-12-27 DIAGNOSIS — M19042 Primary osteoarthritis, left hand: Secondary | ICD-10-CM

## 2019-12-27 DIAGNOSIS — M12811 Other specific arthropathies, not elsewhere classified, right shoulder: Secondary | ICD-10-CM | POA: Insufficient documentation

## 2019-12-27 DIAGNOSIS — M75101 Unspecified rotator cuff tear or rupture of right shoulder, not specified as traumatic: Secondary | ICD-10-CM

## 2019-12-27 DIAGNOSIS — M7711 Lateral epicondylitis, right elbow: Secondary | ICD-10-CM

## 2019-12-27 DIAGNOSIS — M25511 Pain in right shoulder: Secondary | ICD-10-CM | POA: Diagnosis not present

## 2019-12-27 DIAGNOSIS — M19241 Secondary osteoarthritis, right hand: Secondary | ICD-10-CM

## 2019-12-27 DIAGNOSIS — M19041 Primary osteoarthritis, right hand: Secondary | ICD-10-CM

## 2019-12-27 HISTORY — DX: Pure hypercholesterolemia, unspecified: E78.00

## 2019-12-27 MED ORDER — MELOXICAM 15 MG PO TABS: ORAL_TABLET | ORAL | 3 refills | Status: DC

## 2019-12-27 NOTE — Assessment & Plan Note (Signed)
Sara Harper also has significant DDD, she had a transforaminal epidural recently with Dr. Alvester Morin, I have advised her to keep her spine care with Dr. Alvester Morin.

## 2019-12-27 NOTE — Progress Notes (Signed)
    Procedures performed today:    Procedure: Real-time Ultrasound Guided injection of the right glenohumeral joint Device: Samsung HS60  Verbal informed consent obtained.  Time-out conducted.  Noted no overlying erythema, induration, or other signs of local infection.  Skin prepped in a sterile fashion.  Local anesthesia: Topical Ethyl chloride.  With sterile technique and under real time ultrasound guidance:  Noted full-thickness retracted supraspinatus tear, thus the glenohumeral joint injection communicates with the subacromial space, 1 cc Kenalog 40, 2 cc lidocaine, 2 cc bupivacaine injected easily Completed without difficulty  Pain immediately resolved suggesting accurate placement of the medication.  Advised to call if fevers/chills, erythema, induration, drainage, or persistent bleeding.  Images permanently stored and available for review in the ultrasound unit.  Impression: Technically successful ultrasound guided injection.  Independent interpretation of notes and tests performed by another provider:   None.  Brief History, Exam, Impression, and Recommendations:    Rotator cuff tear arthropathy, right Noted full-thickness retracted rotator cuff tear on ultrasound, she also has some glenohumeral osteoarthritis, pain is likely due to cuff tear arthropathy, glenohumeral joint injection today and she has failed all other treatments, formal physical therapy, return to see me in a month.  Primary osteoarthritis of both hands Right worse than left and worse the DIP joints. Switching from ibuprofen to meloxicam, I would like her to do some hand therapy as well. Return to see me in a month, we will consider injections if no better.  Lumbar spondylosis Sara Harper also has significant DDD, she had a transforaminal epidural recently with Dr. Alvester Morin, I have advised her to keep her spine care with Dr. Alvester Morin.    ___________________________________________ Sara Harper. Sara Harper, M.D.,  ABFM., CAQSM. Primary Care and Sports Medicine [facility]  Adjunct Instructor of Family Medicine  [employer]

## 2019-12-27 NOTE — Assessment & Plan Note (Signed)
Noted full-thickness retracted rotator cuff tear on ultrasound, she also has some glenohumeral osteoarthritis, pain is likely due to cuff tear arthropathy, glenohumeral joint injection today and she has failed all other treatments, formal physical therapy, return to see me in a month.

## 2019-12-27 NOTE — ED Triage Notes (Signed)
Seen on 6/3 for pain -returns for increased pain - pt c/o of a "popping  sensation": in right arm when she was getting dressed yesterday No OTC meds this am Pt took prescribed pain med last night

## 2019-12-27 NOTE — ED Provider Notes (Signed)
[facility]    CSN: [id number] Arrival date & time: 12/27/19  0913      History   Chief Complaint Chief Complaint  Patient presents with  . Arm Pain    Right  . Hand Pain    Right    HPI Zain Bingman is a 74 y.o. female.   Patient is Spanish speaking; interview facilitated by interpretor. She was evaluated here five days ago for chronic pain in her right shoulder and right hand.  Right shoulder x-ray at that time revealed significant osteoarthritis, likely a result of a chronic rotator cuff tear.  Right hand x-ray revealed scattered osteoarthritis.  She was treated with Flexeril and a tapering dose of prednisone, and advised to follow-up with sports medicine or orthopedics for management. She returns today complaining of pain in her right elbow, in addition to little improvement in her right shoulder and right hand.  She reports that her pain is severe enough to cause difficulty dressing.  The history is provided by the patient. A language interpreter was used.    Past Medical History:  Diagnosis Date  . Diabetes mellitus without complication ([place])   . High cholesterol   . Hypertension     Patient Active Problem List   Diagnosis Date Noted  . Depression 05/03/2017  . Type 2 diabetes mellitus without complication, without long-term current use of insulin ([place]) 04/22/2017  . Essential hypertension 04/22/2017  . Language barrier to communication 04/22/2017  . Glucosuria 04/22/2017    Past Surgical History:  Procedure Laterality Date  . ABDOMINAL HYSTERECTOMY    . CHOLECYSTECTOMY      OB History   No obstetric history on file.      Home Medications    Prior to Admission medications   Medication Sig Start Date End Date Taking? Authorizing Provider  cetirizine (ZYRTEC ALLERGY) 10 MG tablet Take 1 tablet (10 mg total) by mouth daily. 10/28/19  Yes Jaynee Eagles, PA-C  cyclobenzaprine (FLEXERIL) 5 MG tablet Take 1 tablet (5 mg total) by mouth 2 (two) times  daily as needed for muscle spasms. 12/22/19  Yes Phelps, Erin O, PA-C  enalapril (VASOTEC) 10 MG tablet Take 1 tablet (10 mg total) by mouth daily. 12/07/19 03/06/20 Yes Vevelyn Francois, NP  gabapentin (NEURONTIN) 300 MG capsule Take 1 capsule (300 mg total) by mouth 3 (three) times daily. 10/06/19 01/04/20 Yes Vevelyn Francois, NP  glyBURIDE (DIABETA) 5 MG tablet Take 1 tablet (5 mg total) by mouth daily with breakfast. 12/07/19 03/06/20 Yes Vevelyn Francois, NP  metFORMIN (GLUCOPHAGE) 500 MG tablet Take 1 tablet (500 mg total) by mouth 2 (two) times daily with a meal. 12/07/19 03/06/20 Yes Vevelyn Francois, NP  simvastatin (ZOCOR) 10 MG tablet Take 1 tablet (10 mg total) by mouth every evening. 09/14/19 09/13/20 Yes Vevelyn Francois, NP  Melatonin 10 MG CAPS Take 20 mg by mouth at bedtime as needed. May start with 10mg  10/06/19   Vevelyn Francois, NP  predniSONE (DELTASONE) 20 MG tablet 3 tabs po day one, then 2 po daily x 4 days 12/22/19   Noe Gens, PA-C  triamcinolone cream (KENALOG) 0.1 % Apply 1 application topically 2 (two) times daily. 10/24/19   Vevelyn Francois, NP    Family History Family History  Problem Relation Age of Onset  . Cancer Mother   . Heart failure Father     Social History Social History   Tobacco Use  . Smoking status: Never  Smoker  . Smokeless tobacco: Never Used  Substance Use Topics  . Alcohol use: No    Alcohol/week: 0.0 standard drinks  . Drug use: No     Allergies   Patient has no known allergies.   Review of Systems Review of Systems  Constitutional: Negative.   HENT: Negative.   Eyes: Negative.   Respiratory: Negative.   Cardiovascular: Negative.   Gastrointestinal: Negative.   Genitourinary: Negative.   Musculoskeletal: Positive for arthralgias and joint swelling.       Right shoulder, elbow, and hand pain  Skin: Negative.      Physical Exam Triage Vital Signs ED Triage Vitals  Enc Vitals Group     BP 12/27/19 1030 (!) 149/85     Pulse Rate  12/27/19 1030 65     Resp 12/27/19 1030 17     Temp 12/27/19 1030 99 F (37.2 C)     Temp Source 12/27/19 1030 Oral     SpO2 12/27/19 1030 99 %     Weight --      Height --      Head Circumference --      Peak Flow --      Pain Score 12/27/19 1029 2     Pain Loc --      Pain Edu? --      Excl. in GC? --    No data found.  Updated Vital Signs BP (!) 149/85 (BP Location: Left Arm)   Pulse 65   Temp 99 F (37.2 C) (Oral)   Resp 17   SpO2 99%   Visual Acuity Right Eye Distance:   Left Eye Distance:   Bilateral Distance:    Right Eye Near:   Left Eye Near:    Bilateral Near:     Physical Exam Vitals and nursing note reviewed.  Constitutional:      General: She is not in acute distress. HENT:     Head: Normocephalic.  Eyes:     Pupils: Pupils are equal, round, and reactive to light.  Cardiovascular:     Rate and Rhythm: Normal rate.  Pulmonary:     Effort: Pulmonary effort is normal.  Musculoskeletal:     Right elbow: No swelling or deformity. Normal range of motion. Tenderness present in lateral epicondyle.     Right hand: Deformity and bony tenderness present.     Comments: Right shoulder has limited range of motion; unable to abduct above horizontal.  Exam documented on previous visit.  Right hand has multiple joint deformities.  Exam documented on previous visit.  Right elbow: There is distinct tenderness over the lateral epicondyle.  Palpation there during resisted dorsiflexion and supination of the wrist recreates her pain.   Skin:    General: Skin is warm and dry.  Neurological:     Mental Status: She is alert.      UC Treatments / Results  Labs (all labs ordered are listed, but only abnormal results are displayed) Labs Reviewed - No data to display  EKG   Radiology No results found.  Procedures Procedures (including critical care time)  Medications Ordered in UC Medications - No data to display  Initial Impression / Assessment and  Plan / UC Course  I have reviewed the triage vital signs and the nursing notes.  Pertinent labs & imaging results that were available during my care of the patient were reviewed by me and considered in my medical decision making (see chart for details).  Patient has additional problem today of right lateral epicondylitis. Will refer to Dr. Rodney Langton for further evaluation and treatment (appt today at 2:45pm))   Final Clinical Impressions(s) / UC Diagnoses   Final diagnoses:  Chronic right shoulder pain  Other secondary osteoarthritis of right hand  Lateral epicondylitis of right elbow   Discharge Instructions   None    ED Prescriptions    None        Lattie Haw, MD 12/27/19 1941

## 2019-12-27 NOTE — Assessment & Plan Note (Signed)
Right worse than left and worse the DIP joints. Switching from ibuprofen to meloxicam, I would like her to do some hand therapy as well. Return to see me in a month, we will consider injections if no better.

## 2019-12-28 ENCOUNTER — Ambulatory Visit (INDEPENDENT_AMBULATORY_CARE_PROVIDER_SITE_OTHER): Payer: Medicaid Other | Admitting: Orthopaedic Surgery

## 2019-12-28 ENCOUNTER — Encounter: Payer: Self-pay | Admitting: Orthopaedic Surgery

## 2019-12-28 VITALS — Ht 59.0 in | Wt 144.0 lb

## 2019-12-28 DIAGNOSIS — G8929 Other chronic pain: Secondary | ICD-10-CM

## 2019-12-28 DIAGNOSIS — M5441 Lumbago with sciatica, right side: Secondary | ICD-10-CM | POA: Diagnosis not present

## 2019-12-28 NOTE — Progress Notes (Signed)
Office Visit Note   Patient: Sara Harper           Date of Birth: 06-26-1946           MRN: [id number] Visit Date: 12/28/2019              Requested by: Massie Maroon, FNP [address] [place],  [place] [address] PCP: Barbette Merino, NP   Assessment & Plan: Visit Diagnoses:  1. Chronic right-sided low back pain with right-sided sciatica     Plan: Impression is continued lumbar radiculopathy with partial relief from the first ESI.  We will reorder ESI with Dr.Newton.  We will see her back as needed.  Follow-Up Instructions: Return if symptoms worsen or fail to improve.   Orders:  No orders of the defined types were placed in this encounter.  No orders of the defined types were placed in this encounter.     Procedures: No procedures performed   Clinical Data: No additional findings.   Subjective: Chief Complaint  Patient presents with  . Lower Back - Follow-up    Sara Harper returns today for lumbar radiculopathy.  She underwent an ESI injection with Dr. Alvester Morin on 527 and reports that she has had significant provement but she is hoping to get more relief if she is able to get a second injection.  No changes in medical history of bowel or bladder dysfunction.   Review of Systems  Constitutional: Negative.   HENT: Negative.   Eyes: Negative.   Respiratory: Negative.   Cardiovascular: Negative.   Endocrine: Negative.   Musculoskeletal: Negative.   Neurological: Negative.   Hematological: Negative.   Psychiatric/Behavioral: Negative.   All other systems reviewed and are negative.    Objective: Vital Signs: Ht 4\' 11"  (1.499 m)   Wt 144 lb (65.3 kg)   BMI 29.08 kg/m   Physical Exam Vitals and nursing note reviewed.  Constitutional:      Appearance: She is well-developed.  Pulmonary:     Effort: Pulmonary effort is normal.  Skin:    General: Skin is warm.     Capillary Refill: Capillary refill takes less than 2 seconds.  Neurological:   Mental Status: She is alert and oriented to person, place, and time.  Psychiatric:        Behavior: Behavior normal.        Thought Content: Thought content normal.        Judgment: Judgment normal.     Ortho Exam Exam is stable. Specialty Comments:  No specialty comments available.  Imaging: No results found.   PMFS History: Patient Active Problem List   Diagnosis Date Noted  . Rotator cuff tear arthropathy, right 12/27/2019  . Primary osteoarthritis of both hands 12/27/2019  . Lumbar spondylosis 12/27/2019  . Depression 05/03/2017  . Type 2 diabetes mellitus without complication, without long-term current use of insulin ([facility]) 04/22/2017  . Essential hypertension 04/22/2017  . Language barrier to communication 04/22/2017  . Glucosuria 04/22/2017   Past Medical History:  Diagnosis Date  . Diabetes mellitus without complication ([facility])   . High cholesterol   . Hypertension     Family History  Problem Relation Age of Onset  . Cancer Mother   . Heart failure Father     Past Surgical History:  Procedure Laterality Date  . ABDOMINAL HYSTERECTOMY    . CHOLECYSTECTOMY     Social History   Occupational History  . Not on file  Tobacco Use  .  Smoking status: Never Smoker  . Smokeless tobacco: Never Used  Substance and Sexual Activity  . Alcohol use: No    Alcohol/week: 0.0 standard drinks  . Drug use: No  . Sexual activity: Not on file

## 2019-12-29 ENCOUNTER — Other Ambulatory Visit: Payer: Self-pay

## 2019-12-29 DIAGNOSIS — G8929 Other chronic pain: Secondary | ICD-10-CM

## 2020-01-02 ENCOUNTER — Ambulatory Visit: Payer: Medicaid Other | Admitting: Rehabilitative and Restorative Service Providers"

## 2020-01-10 ENCOUNTER — Other Ambulatory Visit: Payer: Self-pay

## 2020-01-10 ENCOUNTER — Ambulatory Visit: Payer: Medicaid Other | Attending: Sports Medicine | Admitting: Physical Therapy

## 2020-01-10 ENCOUNTER — Encounter: Payer: Self-pay | Admitting: Physical Therapy

## 2020-01-10 DIAGNOSIS — M25511 Pain in right shoulder: Secondary | ICD-10-CM | POA: Diagnosis present

## 2020-01-10 DIAGNOSIS — M25611 Stiffness of right shoulder, not elsewhere classified: Secondary | ICD-10-CM

## 2020-01-10 DIAGNOSIS — M5442 Lumbago with sciatica, left side: Secondary | ICD-10-CM | POA: Insufficient documentation

## 2020-01-10 NOTE — Patient Instructions (Signed)
Access Code: [email or web address] URL: [email or web address] Date: 01/10/2020 Prepared by: Stacie Glaze  Exercises Hooklying Single Knee to Chest Stretch - 2 x daily - 7 x weekly - 1 sets - 10 reps - 10- hold Supine Double Knee to Chest - 2 x daily - 7 x weekly - 1 sets - 10 reps - 10 hold Supine Lower Trunk Rotation - 2 x daily - 7 x weekly - 1 sets - 10 reps - 10 hold Standing Single Arm Shoulder Flexion Stretch on Wall - 2 x daily - 7 x weekly - 1 sets - 10 reps - 3 hold Standing Bilateral Shoulder Internal Rotation AAROM with Dowel - 2 x daily - 7 x weekly - 1 sets - 10 reps - 3 hold

## 2020-01-10 NOTE — Therapy (Signed)
[facility] [address] [place], [place], [address] Phone: [phone]   Fax:  [phone]  Physical Therapy Evaluation  Patient Details  Name: Sara Harper MRN: [id number] Date of Birth: 10-03-1945 Referring Provider (PT): Dr. Karie Schwalbe   Encounter Date: 01/10/2020   PT End of Session - 01/10/20 1651    Visit Number 1    Date for PT Re-Evaluation 03/11/20    Authorization Type Medicare    PT Start Time 1605    PT Stop Time 1650    PT Time Calculation (min) 45 min    Activity Tolerance Patient tolerated treatment well    Behavior During Therapy [facility] for tasks assessed/performed           Past Medical History:  Diagnosis Date  . Diabetes mellitus without complication ([facility])   . High cholesterol   . Hypertension     Past Surgical History:  Procedure Laterality Date  . ABDOMINAL HYSTERECTOMY    . CHOLECYSTECTOMY      There were no vitals filed for this visit.    Subjective Assessment - 01/10/20 1609    Subjective Patient reports that a few weeks ago she was taking off the bra and felt and heard a noise in the right shoulder and reports significant pain since then.  She had an injection, it has helped but still pain with reaching, she is right handed, x-rays show arthritis  She also has DDD in the lwo back with some stenosis, she reports that she has had pain for a number of years , she had steroid injection in the low back that helped some,    Limitations Lifting;House hold activities    Patient Stated Goals have better ROM and less pain    Currently in Pain? Yes    Pain Score 4     Pain Location Shoulder   pain into the elbow and the fingers   Pain Orientation Right;Upper;Lower;Lateral    Pain Descriptors / Indicators Aching;Sore;Sharp    Pain Type Acute pain    Pain Radiating Towards pain in the right rhomboid, the right elbow and the right hand    Pain Onset 1 to 4 weeks ago    Pain Frequency Constant     Aggravating Factors  reaching behind pain up to 8/10    Pain Relieving Factors rest, at best pain a 4/10    Effect of Pain on Daily Activities difficulty walking due to pain with arm swing, difficulty dressing and doing hair              [facility] PT Assessment - 01/10/20 0001      Assessment   Medical Diagnosis right shoulder pain    Referring Provider (PT) Dr. Karie Schwalbe    Onset Date/Surgical Date 12/20/19    Hand Dominance Right    Prior Therapy no      Precautions   Precautions None      Balance Screen   Has the patient fallen in the past 6 months No    Has the patient had a decrease in activity level because of a fear of falling?  No    Is the patient reluctant to leave their home because of a fear of falling?  No      Home Environment   Additional Comments does some cooking, some cleaning       Prior Function   Level of Independence Independent    Vocation Retired    Leisure some  walking      Posture/Postural Control   Posture Comments fwd head, rounded shoulders      ROM / Strength   AROM / PROM / Strength AROM;PROM;Strength      AROM   Overall AROM Comments Lumbar ROM is decreased 25% with c/o tightness    AROM Assessment Site Shoulder    Right/Left Shoulder Right    Right Shoulder Flexion 140 Degrees    Right Shoulder ABduction 125 Degrees    Right Shoulder Internal Rotation 40 Degrees   very painful   Right Shoulder External Rotation 68 Degrees      PROM   PROM Assessment Site Shoulder    Right/Left Shoulder Right    Right Shoulder Flexion 160 Degrees    Right Shoulder ABduction 140 Degrees    Right Shoulder Internal Rotation 45 Degrees    Right Shoulder External Rotation 78 Degrees      Strength   Overall Strength Comments ER very painful 3+/5, IR 4-/5, flexion and abduction 4-/5 with pain, left hip strength is 4-/5 with pain in  the left buttock and low back      Flexibility   Soft Tissue Assessment /Muscle Length yes    Hamstrings tight    Piriformis  tight      Palpation   Palpation comment tight and tender inthe right upper trap, the right upper arm, biceps area and into the right elbow      Ambulation/Gait   Gait Comments she has a slight antalgic gait on the left with walking                      Objective measurements completed on examination: See above findings.                 PT Short Term Goals - 01/10/20 1659      PT SHORT TERM GOAL #1   Title independent with initial HEP    Time 2    Status New             PT Long Term Goals - 01/10/20 1701      PT LONG TERM GOAL #1   Title understand posture and body mechanics    Time 8    Period Weeks    Status New      PT LONG TERM GOAL #2   Title decrease shoulder pain 50%    Time 8    Period Weeks    Status New      PT LONG TERM GOAL #3   Title report no difficulty with dressing or doing her hair    Time 8    Period Weeks    Status New      PT LONG TERM GOAL #4   Title increase right shoulder IR to 65 degrees    Time 8    Period Weeks    Status New      PT LONG TERM GOAL #5   Title decrease LBP 50%    Time 8    Period Weeks    Status New                  Plan - 01/10/20 1653    Clinical Impression Statement Patient has seen a few MD's recently for a right shoulder RC tear confirmed on [place], she also so orthopod for lumbar DDD.  She hadd an epidural injection that helped some, she is right handed and limited with IR and extension,  she is very weal and painful with these motions, her back ROM is cecreased 25% with tightness and pain, has tight LE mms, she is very tender inthe right upper traps, right upper arm/shoulder and left low back, the lumbar mms are very tight    Personal Factors and Comorbidities Comorbidity 3+    Comorbidities DM, HTN, depression    Examination-Activity Limitations Reach Overhead;Carry;Squat;Stairs;Dressing;Toileting;Hygiene/Grooming;Lift;Locomotion Level    Examination-Participation Restrictions  Cleaning;Meal Prep;Community Activity;Shop;Laundry;Yard Work    Stability/Clinical Decision Making Evolving/Moderate complexity    Clinical Decision Making Moderate    Rehab Potential Good    PT Frequency 2x / week    PT Duration 8 weeks    PT Treatment/Interventions ADLs/Self Care Home Management;Electrical Stimulation;Cryotherapy;Iontophoresis 4mg /ml Dexamethasone;Moist Heat;Traction;Ultrasound;Stair training;Functional mobility training;Therapeutic activities;Therapeutic exercise;Neuromuscular re-education;Manual techniques;Patient/family education    PT Next Visit Plan she changes over to Medicare next week, we will begin treatment working on shoulder ROM, decreasing pain and increasing funciont, can treat the LBP as well    Consulted and Agree with Plan of Care Patient           Patient will benefit from skilled therapeutic intervention in order to improve the following deficits and impairments:  Abnormal gait, Decreased range of motion, Difficulty walking, Impaired UE functional use, Increased muscle spasms, Decreased activity tolerance, Pain, Improper body mechanics, Impaired flexibility, Decreased strength  Visit Diagnosis: Acute pain of right shoulder - Plan: PT plan of care cert/re-cert  Stiffness of right shoulder, not elsewhere classified - Plan: PT plan of care cert/re-cert  Acute left-sided low back pain with left-sided sciatica - Plan: PT plan of care cert/re-cert     Problem List Patient Active Problem List   Diagnosis Date Noted  . Rotator cuff tear arthropathy, right 12/27/2019  . Primary osteoarthritis of both hands 12/27/2019  . Lumbar spondylosis 12/27/2019  . Depression 05/03/2017  . Type 2 diabetes mellitus without complication, without long-term current use of insulin ([place]) 04/22/2017  . Essential hypertension 04/22/2017  . Language barrier to communication 04/22/2017  . Glucosuria 04/22/2017    Sumner Boast., PT 01/10/2020, 5:56 PM  [place] [address] [place], [place], [address] Phone: [phone]   Fax:  [phone]  Name: Sara Harper MRN: [id number] Date of Birth: 09/22/1945

## 2020-01-24 ENCOUNTER — Ambulatory Visit: Payer: Medicaid Other | Admitting: Sports Medicine

## 2020-01-26 ENCOUNTER — Other Ambulatory Visit: Payer: Self-pay

## 2020-01-26 ENCOUNTER — Ambulatory Visit: Payer: Medicare Other | Attending: Sports Medicine | Admitting: Physical Therapy

## 2020-01-26 DIAGNOSIS — M6281 Muscle weakness (generalized): Secondary | ICD-10-CM | POA: Diagnosis present

## 2020-01-26 DIAGNOSIS — M5441 Lumbago with sciatica, right side: Secondary | ICD-10-CM | POA: Insufficient documentation

## 2020-01-26 DIAGNOSIS — M5442 Lumbago with sciatica, left side: Secondary | ICD-10-CM | POA: Diagnosis present

## 2020-01-26 DIAGNOSIS — M25611 Stiffness of right shoulder, not elsewhere classified: Secondary | ICD-10-CM | POA: Diagnosis present

## 2020-01-26 DIAGNOSIS — G8929 Other chronic pain: Secondary | ICD-10-CM | POA: Insufficient documentation

## 2020-01-26 DIAGNOSIS — M25511 Pain in right shoulder: Secondary | ICD-10-CM | POA: Diagnosis present

## 2020-01-26 NOTE — Therapy (Signed)
[facility] [address] [place], [place], [address] Phone: [phone]   Fax:  [phone]  Physical Therapy Treatment  Patient Details  Name: Sara Harper MRN: [id number] Date of Birth: October 20, 1945 Referring Provider (PT): Dr. Karie Schwalbe   Encounter Date: 01/26/2020   PT End of Session - 01/26/20 1755    Visit Number 2    Date for PT Re-Evaluation 03/11/20    PT Start Time 1615    PT Stop Time 1705    PT Time Calculation (min) 50 min           Past Medical History:  Diagnosis Date   Diabetes mellitus without complication (HCC)    High cholesterol    Hypertension     Past Surgical History:  Procedure Laterality Date   ABDOMINAL HYSTERECTOMY     CHOLECYSTECTOMY      There were no vitals filed for this visit.   Subjective Assessment - 01/26/20 1615    Subjective Patient reports that she has been in a lot of pain especially with movement of shoulder and has swelling in her R LE. She was not able to sleep last night.    Limitations Lifting;House hold activities    Pain Score 3     Pain Location Shoulder    Pain Orientation Right                             [facility] Adult PT Treatment/Exercise - 01/26/20 0001      Exercises   Exercises Shoulder;Lumbar      Lumbar Exercises: Aerobic   UBE (Upper Arm Bike) L1 fwd/69min bkwd w. frequent rest breaks      Shoulder Exercises: Supine   External Rotation --   cane   External Rotation Limitations x2    Internal Rotation --   with cane   Flexion --   cane   Flexion Limitations x2      Shoulder Exercises: Standing   External Rotation AROM;10 reps;Right   cane x2   Internal Rotation AROM;Strengthening;10 reps   cane x 2   Flexion AROM;Strengthening;10 reps   cane x 2   Extension Strengthening;Both;10 reps;Theraband    Theraband Level (Shoulder Extension) Level 2 (Red)    Extension Weight (lbs) x2    Row Strengthening;Both;15  reps;Theraband    Theraband Level (Shoulder Row) Level 2 (Red)    Row Weight (lbs) x2    Retraction Strengthening;Both;10 reps;Theraband   red band   Theraband Level (Shoulder Retraction) Level 2 (Red)    Retraction Limitations x2      Modalities   Modalities Electrical Stimulation;Moist Heat      Moist Heat Therapy   Number Minutes Moist Heat 15 Minutes    Moist Heat Location Lumbar Spine;Shoulder      Electrical Stimulation   Electrical Stimulation Location R shoulder    Electrical Stimulation Action IFC    Electrical Stimulation Goals Pain      Manual Therapy   Manual Therapy Passive ROM    Passive ROM R shoulder in all directions- increased pain with LR/MR                    PT Short Term Goals - 01/10/20 1659      PT SHORT TERM GOAL #1   Title independent with initial HEP    Time 2    Status New  PT Long Term Goals - 01/10/20 1701      PT LONG TERM GOAL #1   Title understand posture and body mechanics    Time 8    Period Weeks    Status New      PT LONG TERM GOAL #2   Title decrease shoulder pain 50%    Time 8    Period Weeks    Status New      PT LONG TERM GOAL #3   Title report no difficulty with dressing or doing her hair    Time 8    Period Weeks    Status New      PT LONG TERM GOAL #4   Title increase right shoulder IR to 65 degrees    Time 8    Period Weeks    Status New      PT LONG TERM GOAL #5   Title decrease LBP 50%    Time 8    Period Weeks    Status New                 Plan - 01/26/20 1756    Clinical Impression Statement Patient tolerated tband and cane exercises well, however, experiences increasing pain with medial rotation. Patient required frequent rest breaks on UBE and max cueing for technique for TE.    Personal Factors and Comorbidities Comorbidity 3+    Comorbidities DM, HTN, depression    Examination-Activity Limitations Reach  Overhead;Carry;Squat;Stairs;Dressing;Toileting;Hygiene/Grooming;Lift;Locomotion Level    Examination-Participation Restrictions Cleaning;Meal Prep;Community Activity;Shop;Laundry;Yard Work    Rehab Potential Good    PT Frequency 2x / week    PT Duration 8 weeks    PT Treatment/Interventions ADLs/Self Care Home Management;Electrical Stimulation;Cryotherapy;Iontophoresis 4mg /ml Dexamethasone;Moist Heat;Traction;Ultrasound;Stair training;Functional mobility training;Therapeutic activities;Therapeutic exercise;Neuromuscular re-education;Manual techniques;Patient/family education    PT Next Visit Plan Assess effects of estim, progress TE.    Consulted and Agree with Plan of Care Patient           Patient will benefit from skilled therapeutic intervention in order to improve the following deficits and impairments:  Abnormal gait, Decreased range of motion, Difficulty walking, Impaired UE functional use, Increased muscle spasms, Decreased activity tolerance, Pain, Improper body mechanics, Impaired flexibility, Decreased strength  Visit Diagnosis: Acute pain of right shoulder  Stiffness of right shoulder, not elsewhere classified  Acute left-sided low back pain with left-sided sciatica     Problem List Patient Active Problem List   Diagnosis Date Noted   Rotator cuff tear arthropathy, right 12/27/2019   Primary osteoarthritis of both hands 12/27/2019   Lumbar spondylosis 12/27/2019   Depression 05/03/2017   Type 2 diabetes mellitus without complication, without long-term current use of insulin (HCC) 04/22/2017   Essential hypertension 04/22/2017   Language barrier to communication 04/22/2017   Glucosuria 04/22/2017    06/22/2017, SPTA 01/26/2020, 6:00 PM  [facility] [address] [place], [place], [place] Phone: [phone]   Fax:  [phone]  Name: Sara Harper MRN: Timoteo Gaul Date of Birth:  [birth year]

## 2020-02-06 ENCOUNTER — Ambulatory Visit (INDEPENDENT_AMBULATORY_CARE_PROVIDER_SITE_OTHER): Payer: Medicare Other | Admitting: Physical Medicine and Rehabilitation

## 2020-02-06 ENCOUNTER — Encounter: Payer: Self-pay | Admitting: Physical Medicine and Rehabilitation

## 2020-02-06 ENCOUNTER — Ambulatory Visit: Payer: Self-pay

## 2020-02-06 ENCOUNTER — Other Ambulatory Visit: Payer: Self-pay

## 2020-02-06 VITALS — BP 129/79 | HR 97

## 2020-02-06 DIAGNOSIS — M48062 Spinal stenosis, lumbar region with neurogenic claudication: Secondary | ICD-10-CM

## 2020-02-06 DIAGNOSIS — M5416 Radiculopathy, lumbar region: Secondary | ICD-10-CM | POA: Diagnosis not present

## 2020-02-06 MED ORDER — METHYLPREDNISOLONE ACETATE 80 MG/ML IJ SUSP
80.0000 mg | Freq: Once | INTRAMUSCULAR | Status: AC
  Administered 2020-02-06: 80 mg

## 2020-02-06 NOTE — Progress Notes (Signed)
Pt states pain in her lower back that travels down her right leg. Pt states walking makes the pain worse. Pt states laying down during the night it didn't help. Pt has hx of epidural injection on 12/15/19, pt states last inj helped for a month. Numeric Pain Rating Scale and Functional Assessment Average Pain 5   In the last MONTH (on 0-10 scale) has pain interfered with the following?  1. General activity like being  able to carry out your everyday physical activities such as walking, climbing stairs, carrying groceries, or moving a chair?  Rating(5)   +Driver, -BT, -Dye Allergies.

## 2020-02-07 ENCOUNTER — Encounter: Payer: Medicare Other | Admitting: Physical Therapy

## 2020-02-09 ENCOUNTER — Ambulatory Visit: Payer: Medicare Other

## 2020-02-09 ENCOUNTER — Other Ambulatory Visit: Payer: Self-pay

## 2020-02-09 DIAGNOSIS — M25511 Pain in right shoulder: Secondary | ICD-10-CM

## 2020-02-09 DIAGNOSIS — M25611 Stiffness of right shoulder, not elsewhere classified: Secondary | ICD-10-CM

## 2020-02-09 DIAGNOSIS — M5442 Lumbago with sciatica, left side: Secondary | ICD-10-CM

## 2020-02-09 NOTE — Therapy (Signed)
[facility] [address] [place], [place], [address] Phone: [phone]   Fax:  [phone]  Physical Therapy Treatment  Patient Details  Name: Sara Harper MRN: [id number] Date of Birth: 11-04-1945 Referring Provider (PT): Dr. Karie Schwalbe   Encounter Date: 02/09/2020   PT End of Session - 02/09/20 1737    Visit Number 3    Date for PT Re-Evaluation 03/11/20    PT Start Time 1653    PT Stop Time 1732    PT Time Calculation (min) 39 min    Activity Tolerance Patient tolerated treatment well    Behavior During Therapy [facility] for tasks assessed/performed           Past Medical History:  Diagnosis Date  . Diabetes mellitus without complication (HCC)   . High cholesterol   . Hypertension     Past Surgical History:  Procedure Laterality Date  . ABDOMINAL HYSTERECTOMY    . CHOLECYSTECTOMY      There were no vitals filed for this visit.   Subjective Assessment - 02/09/20 1654    Subjective Pt reports her shoulder is better. She is doing the exercises at home and felt better after last session. Pt reached behind back about 8 days ago and felt a pop in her shoulder with pain to hand after, but exercises helped.    Limitations Lifting;House hold activities    Currently in Pain? No/denies              [facility] PT Assessment - 02/09/20 0001      PROM   Right Shoulder ABduction 160 Degrees                         [facility] Adult PT Treatment/Exercise - 02/09/20 0001      Shoulder Exercises: Supine   External Rotation AAROM;Right;5 reps    External Rotation Limitations 10" holds with cane    Flexion AAROM;Right;10 reps    Flexion Limitations reciprocal inhibition to descend      Shoulder Exercises: Standing   Flexion AAROM;Right;12 reps    Flexion Limitations manual approximation of R scapular and depression to relax UT and facilitate scapulohumeral rhythm    Row Strengthening;Both;15 reps    Theraband  Level (Shoulder Row) Level 2 (Red)    Retraction AROM;Strengthening;Both;15 reps    Retraction Limitations sig upper trap compensation R>L, max tactile cueing       Shoulder Exercises: Stretch   Other Shoulder Stretches Chest opener against corner in 60 height with palms fwd reaching down and away      Manual Therapy   Manual Therapy Joint mobilization;Soft tissue mobilization;Passive ROM    Manual therapy comments in supine    Joint Mobilization posterior and inferior GHJ glides grade 3   inferior with PROM flexion to stretch   Soft tissue mobilization STM to R pecs    Passive ROM R shoulder all planes                    PT Short Term Goals - 01/10/20 1659      PT SHORT TERM GOAL #1   Title independent with initial HEP    Time 2    Status New             PT Long Term Goals - 01/10/20 1701      PT LONG TERM GOAL #1   Title understand posture and body mechanics  Time 8    Period Weeks    Status New      PT LONG TERM GOAL #2   Title decrease shoulder pain 50%    Time 8    Period Weeks    Status New      PT LONG TERM GOAL #3   Title report no difficulty with dressing or doing her hair    Time 8    Period Weeks    Status New      PT LONG TERM GOAL #4   Title increase right shoulder IR to 65 degrees    Time 8    Period Weeks    Status New      PT LONG TERM GOAL #5   Title decrease LBP 50%    Time 8    Period Weeks    Status New                 Plan - 02/09/20 1738    Clinical Impression Statement Pt tolerated session well with minimal c/o pain and improved R shoulder flexion ROM with pt report of shoulder "releasing". She has significant upper trap compensation and hyperactivity with shoulder elevation frequently throughout depression and retraction exercises. Max tactile and verbal cueing required to facilitate scapulohumeral rhythm. Pt advised to try doorway stretch to open chest as well as supine AAROM flexion at home.    Personal  Factors and Comorbidities Comorbidity 3+    Comorbidities DM, HTN, depression    Examination-Activity Limitations Reach Overhead;Carry;Squat;Stairs;Dressing;Toileting;Hygiene/Grooming;Lift;Locomotion Level    Examination-Participation Restrictions Cleaning;Meal Prep;Community Activity;Shop;Laundry;Yard Work    Rehab Potential Good    PT Frequency 2x / week    PT Duration 8 weeks    PT Treatment/Interventions ADLs/Self Care Home Management;Electrical Stimulation;Cryotherapy;Iontophoresis 4mg /ml Dexamethasone;Moist Heat;Traction;Ultrasound;Stair training;Functional mobility training;Therapeutic activities;Therapeutic exercise;Neuromuscular re-education;Manual techniques;Patient/family education    PT Next Visit Plan manual as indicated, increase pain free R shoulder ROM and pec flexibility, scapulohumeral rhythm and periscapular strength.    Consulted and Agree with Plan of Care Patient           Patient will benefit from skilled therapeutic intervention in order to improve the following deficits and impairments:  Abnormal gait, Decreased range of motion, Difficulty walking, Impaired UE functional use, Increased muscle spasms, Decreased activity tolerance, Pain, Improper body mechanics, Impaired flexibility, Decreased strength  Visit Diagnosis: Acute pain of right shoulder  Stiffness of right shoulder, not elsewhere classified  Acute left-sided low back pain with left-sided sciatica     Problem List Patient Active Problem List   Diagnosis Date Noted  . Rotator cuff tear arthropathy, right 12/27/2019  . Primary osteoarthritis of both hands 12/27/2019  . Lumbar spondylosis 12/27/2019  . Depression 05/03/2017  . Type 2 diabetes mellitus without complication, without long-term current use of insulin (HCC) 04/22/2017  . Essential hypertension 04/22/2017  . Language barrier to communication 04/22/2017  . Glucosuria 04/22/2017    06/22/2017, PT, DPT 02/09/2020, 5:41 PM  [facility] [address] [facility] [address] [place], [place], [place] Phone: [phone]   Fax:  [phone]  Name: Sara Harper MRN: Timoteo Gaul Date of Birth: 1945-12-17

## 2020-02-16 ENCOUNTER — Other Ambulatory Visit: Payer: Self-pay

## 2020-02-16 ENCOUNTER — Ambulatory Visit: Payer: Medicare Other | Admitting: Physical Therapy

## 2020-02-16 ENCOUNTER — Encounter: Payer: Self-pay | Admitting: Physical Therapy

## 2020-02-16 DIAGNOSIS — M6281 Muscle weakness (generalized): Secondary | ICD-10-CM

## 2020-02-16 DIAGNOSIS — M25511 Pain in right shoulder: Secondary | ICD-10-CM

## 2020-02-16 DIAGNOSIS — G8929 Other chronic pain: Secondary | ICD-10-CM

## 2020-02-16 DIAGNOSIS — M25611 Stiffness of right shoulder, not elsewhere classified: Secondary | ICD-10-CM

## 2020-02-16 NOTE — Therapy (Signed)
[facility] [place], [place], [address] Phone: [phone]   Fax:  [phone]  Physical Therapy Evaluation  Patient Details  Name: Jenefer Woerner MRN: [id number] Date of Birth: 04-Oct-1945 Referring Provider (PT): Edison Pace   Encounter Date: 02/16/2020   PT End of Session - 02/16/20 1754    Visit Number 4    Date for PT Re-Evaluation 03/11/20    Authorization Type Medicare    PT Start Time 1615    PT Stop Time 1654    PT Time Calculation (min) 39 min    Activity Tolerance Patient tolerated treatment well    Behavior During Therapy [facility] for tasks assessed/performed           Past Medical History:  Diagnosis Date  . Diabetes mellitus without complication ([place])   . High cholesterol   . Hypertension     Past Surgical History:  Procedure Laterality Date  . ABDOMINAL HYSTERECTOMY    . CHOLECYSTECTOMY      There were no vitals filed for this visit.    Subjective Assessment - 02/16/20 1617    Subjective Pt reports that her shoulder is feeling better but her back is hurting now. Pt reports that her R LB is hurting and is radiating into R LE. Pt states that this has been going on for years. She received an injection recently that made it feel better.    Patient is accompained by: Interpreter   spanish   Limitations Lifting;House hold activities    Patient Stated Goals have better ROM and less pain    Currently in Pain? Yes    Pain Score 3     Pain Location Shoulder    Pain Orientation Right    Multiple Pain Sites Yes    Pain Score 7    Pain Location Back    Pain Orientation Right    Pain Descriptors / Indicators Aching    Pain Type Chronic pain    Pain Radiating Towards RLE    Pain Onset More than a month ago    Pain Frequency Intermittent    Aggravating Factors  walking >7 min, bending forward, bending backward    Pain Relieving Factors rest              [facility] PT Assessment - 02/16/20 0001       Assessment   Medical Diagnosis LBP    Referring Provider (PT) Edison Pace    Prior Therapy PT for shoulder      Precautions   Precautions None      Restrictions   Weight Bearing Restrictions No      Balance Screen   Has the patient fallen in the past 6 months No    Has the patient had a decrease in activity level because of a fear of falling?  No    Is the patient reluctant to leave their home because of a fear of falling?  No   pt states that she is sometimes fearful of falling with pain     Home Environment   Additional Comments pt reports trouble with stairs; no stairs in hosue      Prior Function   Level of Independence Independent    Vocation Retired    Leisure some walking      Engineer, production Intact      Functional Tests   Functional tests Sit to Stand      Sit to Stand  Comments slower movement      Posture/Postural Control   Posture/Postural Control Postural limitations    Postural Limitations Rounded Shoulders;Forward head      ROM / Strength   AROM / PROM / Strength AROM;Strength      AROM   Overall AROM Comments Lumbar ROM is decreased 25% with c/o tightness      Strength   Overall Strength Comments 4/5 BLE strength      Flexibility   Soft Tissue Assessment /Muscle Length yes    Hamstrings tight    Piriformis tight      Palpation   Palpation comment tender to palpation B lumbar paraspinals R>L      Transfers   Five time sit to stand comments  >15 sec      Ambulation/Gait   Gait Comments she has a slight antalgic gait on the left with walking                      Objective measurements completed on examination: See above findings.       [facility] Adult PT Treatment/Exercise - 02/16/20 0001      Lumbar Exercises: Stretches   Active Hamstring Stretch Right;Left;1 rep;30 seconds    Lower Trunk Rotation 5 reps;10 seconds      Lumbar Exercises: Aerobic   Nustep L3 x 7 min      Lumbar Exercises: Supine   Bridge  10 reps;3 seconds                  PT Education - 02/16/20 1753    Education Details Pt educated on HEP and POC    Person(s) Educated Patient    Methods Explanation;Handout;Demonstration    Comprehension Verbalized understanding;Returned demonstration            PT Short Term Goals - 02/16/20 1757      PT SHORT TERM GOAL #1   Title independent with initial HEP    Time 2    Period Weeks    Status New    Target Date 03/01/20             PT Long Term Goals - 02/16/20 1757      PT LONG TERM GOAL #1   Title understand posture and body mechanics    Time 8    Period Weeks    Status On-going      PT LONG TERM GOAL #2   Title decrease shoulder pain 50%    Time 8    Period Weeks    Status Partially Met      PT LONG TERM GOAL #3   Title report no difficulty with dressing or doing her hair    Time 8    Period Weeks    Status Partially Met      PT LONG TERM GOAL #4   Title increase right shoulder IR to 65 degrees    Time 8    Period Weeks    Status Partially Met      PT LONG TERM GOAL #5   Title decrease LBP 50%    Time 8    Period Weeks    Status New                  Plan - 02/16/20 1754    Clinical Impression Statement Pt presents to clinic with chronic LBP and RLE radiating pain present for years. Pt demos limited LE flexibility, decreased lumbar ROM, deficits in LE strength, and  tenderness to palpation B lumbar paraspinals. Pt would benefit from skilled PT to address the above impairments. Pt reports that PT is helping with R shoulder pain; feels this has greatly improved. Continue with shoulder PT and incorporate LB.    Personal Factors and Comorbidities Comorbidity 3+    Comorbidities DM, HTN, depression    Examination-Activity Limitations Carry;Squat;Stairs;Toileting;Lift;Locomotion Level;Bend;Sit    Examination-Participation Restrictions Cleaning;Meal Prep;Community Activity;Shop;Laundry;Yard Work    Public affairs consultant Low    Rehab Potential Good    PT Frequency 2x / week    PT Duration 8 weeks    PT Treatment/Interventions ADLs/Self Care Home Management;Electrical Stimulation;Cryotherapy;Iontophoresis '4mg'$ /ml Dexamethasone;Moist Heat;Traction;Ultrasound;Stair training;Functional mobility training;Therapeutic activities;Therapeutic exercise;Neuromuscular re-education;Manual techniques;Patient/family education;Passive range of motion;Dry needling;Balance training    PT Next Visit Plan continue shoulder rehab; incorporate LB ex's/flexibility    PT Home Exercise Plan lower trunk rotations, hamstring stretch, bridges    Consulted and Agree with Plan of Care Patient           Patient will benefit from skilled therapeutic intervention in order to improve the following deficits and impairments:  Abnormal gait, Decreased range of motion, Difficulty walking, Increased muscle spasms, Decreased activity tolerance, Pain, Improper body mechanics, Impaired flexibility, Decreased strength  Visit Diagnosis: Muscle weakness (generalized)  Chronic right-sided low back pain with right-sided sciatica  Acute pain of right shoulder  Stiffness of right shoulder, not elsewhere classified     Problem List Patient Active Problem List   Diagnosis Date Noted  . Rotator cuff tear arthropathy, right 12/27/2019  . Primary osteoarthritis of both hands 12/27/2019  . Lumbar spondylosis 12/27/2019  . Depression 05/03/2017  . Type 2 diabetes mellitus without complication, without long-term current use of insulin ([place]) 04/22/2017  . Essential hypertension 04/22/2017  . Language barrier to communication 04/22/2017  . Glucosuria 04/22/2017   Amador Cunas, PT, DPT Donald Prose [facility] Trias 02/16/2020, 5:59 PM  [facility] [address] [place], [place], [address] Phone: [phone]   Fax:  [phone]  Name: Nori Winegar MRN: [id number] Date of Birth: 04/24/1946

## 2020-02-23 ENCOUNTER — Ambulatory Visit: Payer: Medicare Other | Attending: Sports Medicine | Admitting: Physical Therapy

## 2020-02-23 DIAGNOSIS — M5442 Lumbago with sciatica, left side: Secondary | ICD-10-CM | POA: Insufficient documentation

## 2020-02-23 DIAGNOSIS — M25611 Stiffness of right shoulder, not elsewhere classified: Secondary | ICD-10-CM | POA: Diagnosis present

## 2020-02-23 DIAGNOSIS — M25511 Pain in right shoulder: Secondary | ICD-10-CM | POA: Insufficient documentation

## 2020-02-23 DIAGNOSIS — G8929 Other chronic pain: Secondary | ICD-10-CM | POA: Insufficient documentation

## 2020-02-23 DIAGNOSIS — M5441 Lumbago with sciatica, right side: Secondary | ICD-10-CM | POA: Insufficient documentation

## 2020-02-23 DIAGNOSIS — M6281 Muscle weakness (generalized): Secondary | ICD-10-CM | POA: Diagnosis present

## 2020-02-23 NOTE — Therapy (Signed)
Sara Harper, [place], [address] Phone: [phone]   Fax:  [phone]  Physical Therapy Treatment  Patient Details  Name: Sara Harper MRN: [id number] Date of Birth: May 20, 1946 Referring Provider (PT): Sara Harper   Encounter Date: 02/23/2020   PT End of Session - 02/23/20 1645    Visit Number 5    Date for PT Re-Evaluation 03/11/20    PT Start Time 1610    PT Stop Time 1700    PT Time Calculation (min) 50 min           Past Medical History:  Diagnosis Date  . Diabetes mellitus without complication ([place])   . High cholesterol   . Hypertension     Past Surgical History:  Procedure Laterality Date  . ABDOMINAL HYSTERECTOMY    . CHOLECYSTECTOMY      There were no vitals filed for this visit.   Subjective Assessment - 02/23/20 1606    Subjective feeling better all over. back and leg hurt with prolonged standing/walking    Currently in Pain? Yes    Pain Score 3     Pain Location Back                             [facility] Adult PT Treatment/Exercise - 02/23/20 0001      Lumbar Exercises: Aerobic   Nustep L 4 5 min   increased Left LB and leg pain     Lumbar Exercises: Seated   Sit to Stand 15 reps   with wt ball for UE strength     Lumbar Exercises: Supine   Ab Set 15 reps;3 seconds    Clam 15 reps   red tband   Bent Knee Raise 20 reps   red tband   Bridge with Ball Squeeze Compliant;10 reps    Other Supine Lumbar Exercises feet on ball bridge,KTC and obl      Shoulder Exercises: Supine   Other Supine Exercises 2# supine chest press,abd and flex 10x      Shoulder Exercises: Standing   Other Standing Exercises red tband shld ext,row and ER 10 each    Other Standing Exercises red tband hip ext and abd 10 each      Moist Heat Therapy   Number Minutes Moist Heat 15 Minutes    Moist Heat Location Lumbar Spine      Electrical Stimulation   Electrical Stimulation  Location LB    Electrical Stimulation Action IFC    Electrical Stimulation Parameters supne    Electrical Stimulation Goals Pain      Manual Therapy   Manual Therapy Passive ROM    Manual therapy comments Left HS tighter than RT , but RT is painful    Passive ROM LE and trunk                     PT Short Term Goals - 02/16/20 1757      PT SHORT TERM GOAL #1   Title independent with initial HEP    Time 2    Period Weeks    Status New    Target Date 03/01/20             PT Long Term Goals - 02/16/20 1757      PT LONG TERM GOAL #1   Title understand posture and body mechanics    Time 8  Period Weeks    Status On-going      PT LONG TERM GOAL #2   Title decrease shoulder pain 50%    Time 8    Period Weeks    Status Partially Met      PT LONG TERM GOAL #3   Title report no difficulty with dressing or doing her hair    Time 8    Period Weeks    Status Partially Met      PT LONG TERM GOAL #4   Title increase right shoulder IR to 65 degrees    Time 8    Period Weeks    Status Partially Met      PT LONG TERM GOAL #5   Title decrease LBP 50%    Time 8    Period Weeks    Status New                 Plan - 02/23/20 1646    Clinical Impression Statement pt tolerated ther ex well for both shld and back but did need alot of verb and tactile cuing ( interpreter present),multi compensation. pt able to demo good log rolling in/out bed. tightness in BIL HS left> RT but pain more on RT per pt. relief with MH/estim    PT Treatment/Interventions ADLs/Self Care Home Management;Electrical Stimulation;Cryotherapy;Iontophoresis 52m/ml Dexamethasone;Moist Heat;Traction;Ultrasound;Stair training;Functional mobility training;Therapeutic activities;Therapeutic exercise;Neuromuscular re-education;Manual techniques;Patient/family education;Passive range of motion;Dry needling;Balance training    PT Next Visit Plan continue shoulder rehab; incorporate LB  ex's/flexibility           Patient will benefit from skilled therapeutic intervention in order to improve the following deficits and impairments:  Abnormal gait, Decreased range of motion, Difficulty walking, Increased muscle spasms, Decreased activity tolerance, Pain, Improper body mechanics, Impaired flexibility, Decreased strength  Visit Diagnosis: Chronic right-sided low back pain with right-sided sciatica  Acute pain of right shoulder  Stiffness of right shoulder, not elsewhere classified  Acute left-sided low back pain with left-sided sciatica  Muscle weakness (generalized)     Problem List Patient Active Problem List   Diagnosis Date Noted  . Rotator cuff tear arthropathy, right 12/27/2019  . Primary osteoarthritis of both hands 12/27/2019  . Lumbar spondylosis 12/27/2019  . Depression 05/03/2017  . Type 2 diabetes mellitus without complication, without long-term current use of insulin [place] 04/22/2017  . Essential hypertension 04/22/2017  . Language barrier to communication 04/22/2017  . Glucosuria 04/22/2017    Sara Harper,Sara Harper 02/23/2020, 4:49 PM  [address] [address]Phone: [phone]  Fax:  [phone] Name: MMimi DebellisMRN: [id number] of Birth: 1July 07, 1947

## 2020-02-27 NOTE — Procedures (Signed)
Lumbosacral Transforaminal Epidural Steroid Injection - Sub-Pedicular Approach with Fluoroscopic Guidance  Patient: Sara Harper      Date of Birth: [birth year] MRN: [id number] PCP: Barbette Merino, NP      Visit Date: 02/06/2020   Universal Protocol:    Date/Time: 02/06/2020  Consent Given By: the patient  Position: PRONE  Additional Comments: Vital signs were monitored before and after the procedure. Patient was prepped and draped in the usual sterile fashion. The correct patient, procedure, and site was verified.   Injection Procedure Details:  Procedure Site One Meds Administered:  Meds ordered this encounter  Medications  . methylPREDNISolone acetate (DEPO-MEDROL) injection 80 mg    Laterality: Right  Location/Site:  L3-L4  Needle size: 22 G  Needle type: Spinal  Needle Placement: Transforaminal  Findings:    -Comments: Excellent flow of contrast along the nerve, nerve root and into the epidural space.  Procedure Details: After squaring off the end-plates to get a true AP view, the C-arm was positioned so that an oblique view of the foramen as noted above was visualized. The target area is just inferior to the "nose of the scotty dog" or sub pedicular. The soft tissues overlying this structure were infiltrated with 2-3 ml. of 1% Lidocaine without Epinephrine.  The spinal needle was inserted toward the target using a "trajectory" view along the fluoroscope beam.  Under AP and lateral visualization, the needle was advanced so it did not puncture dura and was located close the 6 O'Clock position of the pedical in AP tracterory. Biplanar projections were used to confirm position. Aspiration was confirmed to be negative for CSF and/or blood. A 1-2 ml. volume of Isovue-250 was injected and flow of contrast was noted at each level. Radiographs were obtained for documentation purposes.   After attaining the desired flow of contrast documented above, a 0.5 to 1.0 ml test  dose of 0.25% Marcaine was injected into each respective transforaminal space.  The patient was observed for 90 seconds post injection.  After no sensory deficits were reported, and normal lower extremity motor function was noted,   the above injectate was administered so that equal amounts of the injectate were placed at each foramen (level) into the transforaminal epidural space.   Additional Comments:  The patient tolerated the procedure well Dressing: 2 x 2 sterile gauze and Band-Aid    Post-procedure details: Patient was observed during the procedure. Post-procedure instructions were reviewed.  Patient left the clinic in stable condition.

## 2020-02-27 NOTE — Progress Notes (Signed)
Sara Harper - 74 y.o. female MRN [id number]  Date of birth: 11/06/1945  Office Visit Note: Visit Date: 02/06/2020 PCP: Barbette Merino, NP Referred by: Barbette Merino, NP  Subjective: Chief Complaint  Patient presents with  . Lower Back - Pain  . Right Leg - Pain, Weakness   HPI:  Sara Harper is a 74 y.o. female who comes in today for planned repeat Right L3-L4 Lumbar epidural steroid injection with fluoroscopic guidance.  The patient has failed conservative care including home exercise, medications, time and activity modification.  This injection will be diagnostic and hopefully therapeutic.  Please see requesting physician notes for further details and justification. Patient received more than 50% pain relief from prior injection.   Referring: Dr. Glee Arvin   ROS Otherwise per HPI.  Assessment & Plan: Visit Diagnoses:  1. Lumbar radiculopathy   2. Spinal stenosis of lumbar region with neurogenic claudication     Plan: No additional findings.   Meds & Orders:  Meds ordered this encounter  Medications  . methylPREDNISolone acetate (DEPO-MEDROL) injection 80 mg    Orders Placed This Encounter  Procedures  . XR C-ARM NO REPORT  . Ambulatory referral to Physical Therapy  . Epidural Steroid injection    Follow-up: Return if symptoms worsen or fail to improve.   Procedures: No procedures performed  Lumbosacral Transforaminal Epidural Steroid Injection - Sub-Pedicular Approach with Fluoroscopic Guidance  Patient: Sara Harper      Date of Birth: 11/27/1945 MRN: [id number] PCP: Barbette Merino, NP      Visit Date: 02/06/2020   Universal Protocol:    Date/Time: 02/06/2020  Consent Given By: the patient  Position: PRONE  Additional Comments: Vital signs were monitored before and after the procedure. Patient was prepped and draped in the usual sterile fashion. The correct patient, procedure, and site was verified.   Injection Procedure Details:  Procedure  Site One Meds Administered:  Meds ordered this encounter  Medications  . methylPREDNISolone acetate (DEPO-MEDROL) injection 80 mg    Laterality: Right  Location/Site:  L3-L4  Needle size: 22 G  Needle type: Spinal  Needle Placement: Transforaminal  Findings:    -Comments: Excellent flow of contrast along the nerve, nerve root and into the epidural space.  Procedure Details: After squaring off the end-plates to get a true AP view, the C-arm was positioned so that an oblique view of the foramen as noted above was visualized. The target area is just inferior to the "nose of the scotty dog" or sub pedicular. The soft tissues overlying this structure were infiltrated with 2-3 ml. of 1% Lidocaine without Epinephrine.  The spinal needle was inserted toward the target using a "trajectory" view along the fluoroscope beam.  Under AP and lateral visualization, the needle was advanced so it did not puncture dura and was located close the 6 O'Clock position of the pedical in AP tracterory. Biplanar projections were used to confirm position. Aspiration was confirmed to be negative for CSF and/or blood. A 1-2 ml. volume of Isovue-250 was injected and flow of contrast was noted at each level. Radiographs were obtained for documentation purposes.   After attaining the desired flow of contrast documented above, a 0.5 to 1.0 ml test dose of 0.25% Marcaine was injected into each respective transforaminal space.  The patient was observed for 90 seconds post injection.  After no sensory deficits were reported, and normal lower extremity motor function was noted,   the above injectate was administered so that  equal amounts of the injectate were placed at each foramen (level) into the transforaminal epidural space.   Additional Comments:  The patient tolerated the procedure well Dressing: 2 x 2 sterile gauze and Band-Aid    Post-procedure details: Patient was observed during the  procedure. Post-procedure instructions were reviewed.  Patient left the clinic in stable condition.      Clinical History: MRI LUMBAR SPINE WITHOUT CONTRAST  TECHNIQUE: Multiplanar, multisequence MR imaging of the lumbar spine was performed. No intravenous contrast was administered.  COMPARISON:  Lumbar spine radiograph 05/01/2017  FINDINGS: Segmentation:  Standard.  Alignment: Grade 1 L3-L4 anterolisthesis and grade 1 L4-L5 retrolisthesis. Mild S-shaped curvature of the lumbar spine.  Vertebrae: Multilevel discogenic signal changes, greatest at L1-L2. No acute abnormality.  Conus medullaris: Extends to the L1 level and appears normal.  Paraspinal and other soft tissues: 1.5 cm right adrenal nodule.  Disc levels:  T11-T12:  Small disc bulge with mild spinal canal stenosis.  T12-L1: Normal disc space and facets. No spinal canal or neuroforaminal stenosis.  L1-L2: Small diffuse disc bulge.  No stenosis.  L2-L3: Diffuse disc bulge, left eccentric. Narrowing of the left lateral recess. No spinal canal stenosis. Moderate left neural foraminal stenosis.  L3-L4: Diffuse disc bulge, slightly left eccentric. There is severe spinal canal stenosis and bilateral lateral recess stenosis, left worse than right. The right neural foramen remains patent. There is severe left neural foraminal stenosis with nerve root impingement.  L4-L5: Right-greater-than-left facet hypertrophy and a right eccentric disc bulge. Severe right neural foraminal stenosis and neural impingement. No central spinal canal or left neural foraminal stenosis.  L5-S1: Small central disc protrusion superimposed on diffuse disc bulge. Severe right and moderate left neural foraminal stenosis. No spinal canal stenosis.  Visualized sacrum: Normal.  IMPRESSION: 1. Severe spinal canal stenosis at L3-L4 with bilateral lateral recess stenosis and severe left neural foraminal stenosis. 2.  Severe right, moderate left L5-S1 neural foraminal stenosis. 3. Severe right L4-L5 neural foraminal stenosis. 4. Moderate left L2-L3 neural foraminal stenosis.  Electronically Signed: By: Deatra Robinson M.D. On: 06/03/2017 17:29     Objective:  VS:  HT:    WT:   BMI:     BP:129/79  HR:97bpm  TEMP: ( )  RESP:  Physical Exam Constitutional:      General: She is not in acute distress.    Appearance: Normal appearance. She is not ill-appearing.  HENT:     Head: Normocephalic and atraumatic.     Right Ear: External ear normal.     Left Ear: External ear normal.  Eyes:     Extraocular Movements: Extraocular movements intact.  Cardiovascular:     Rate and Rhythm: Normal rate.     Pulses: Normal pulses.  Musculoskeletal:     Right lower leg: No edema.     Left lower leg: No edema.     Comments: Patient has good distal strength with no pain over the greater trochanters.  No clonus or focal weakness.  Skin:    Findings: No erythema, lesion or rash.  Neurological:     General: No focal deficit present.     Mental Status: She is alert and oriented to person, place, and time.     Sensory: No sensory deficit.     Motor: No weakness or abnormal muscle tone.     Coordination: Coordination normal.  Psychiatric:        Mood and Affect: Mood normal.  Behavior: Behavior normal.      Imaging: No results found.

## 2020-02-29 ENCOUNTER — Other Ambulatory Visit: Payer: Self-pay

## 2020-02-29 ENCOUNTER — Ambulatory Visit: Payer: Medicare Other | Admitting: Physical Therapy

## 2020-02-29 ENCOUNTER — Encounter: Payer: Self-pay | Admitting: Physical Therapy

## 2020-02-29 DIAGNOSIS — M5442 Lumbago with sciatica, left side: Secondary | ICD-10-CM

## 2020-02-29 DIAGNOSIS — G8929 Other chronic pain: Secondary | ICD-10-CM

## 2020-02-29 DIAGNOSIS — M25511 Pain in right shoulder: Secondary | ICD-10-CM

## 2020-02-29 DIAGNOSIS — M5441 Lumbago with sciatica, right side: Secondary | ICD-10-CM | POA: Diagnosis not present

## 2020-02-29 DIAGNOSIS — M6281 Muscle weakness (generalized): Secondary | ICD-10-CM

## 2020-02-29 DIAGNOSIS — M25611 Stiffness of right shoulder, not elsewhere classified: Secondary | ICD-10-CM

## 2020-02-29 NOTE — Therapy (Signed)
[place], [place], [address] Phone: [phone]   Fax:  [phone]  Physical Therapy Treatment  Patient Details  Name: Sara Harper MRN: [id number] Date of Birth: July 20, 1946 Referring Provider (PT): Edison Pace   Encounter Date: 02/29/2020   PT End of Session - 02/29/20 1733    Visit Number 6    Date for PT Re-Evaluation 03/11/20    Authorization Type Medicare    PT Start Time 1602    PT Stop Time 1700    PT Time Calculation (min) 58 min    Activity Tolerance Patient tolerated treatment well    Behavior During Therapy [facility] for tasks assessed/performed           Past Medical History:  Diagnosis Date  . Diabetes mellitus without complication ([place])   . High cholesterol   . Hypertension     Past Surgical History:  Procedure Laterality Date  . ABDOMINAL HYSTERECTOMY    . CHOLECYSTECTOMY      There were no vitals filed for this visit.   Subjective Assessment - 02/29/20 1606    Subjective Doing Much better, still some pain with walking, reports a fall on Saturday, reports that she was hurting some in the left shoulder and left breast area    Currently in Pain? Yes    Pain Score 2     Pain Location Back    Pain Orientation Right    Aggravating Factors  walking                             [facility] Adult PT Treatment/Exercise - 02/29/20 0001      Lumbar Exercises: Aerobic   Nustep L 4 5 min      Lumbar Exercises: Seated   Sit to Stand 15 reps    Sit to Stand Limitations with weighted ball    Other Seated Lumbar Exercises on sit fit pelvic mobility and started some stability with red tband and LE exercises some pain in th eleft ribs with rows.      Lumbar Exercises: Supine   Bridge 10 reps;3 seconds    Bridge with [employer] Compliant;10 reps    Other Supine Lumbar Exercises feet on ball K2C, trunk rotation, small bridges and isometric abs      Shoulder Exercises:  Supine   Other Supine Exercises with wand flexion      Shoulder Exercises: Standing   Other Standing Exercises red tband shld ext,row and ER 10 each    Other Standing Exercises wand shoulder extension and IR      Moist Heat Therapy   Number Minutes Moist Heat 15 Minutes    Moist Heat Location Lumbar Spine   also heat to the left ribs     Electrical Stimulation   Electrical Stimulation Location LB    Electrical Stimulation Action IFC    Electrical Stimulation Parameters supine    Electrical Stimulation Goals Pain      Manual Therapy   Manual Therapy Passive ROM    Passive ROM LE's and trunk added quad. piriformis                    PT Short Term Goals - 02/29/20 1744      PT SHORT TERM GOAL #1   Title independent with initial HEP    Status Achieved  PT Long Term Goals - 02/29/20 1745      PT LONG TERM GOAL #1   Title understand posture and body mechanics    Status Partially Met      PT LONG TERM GOAL #2   Title decrease shoulder pain 50%      PT LONG TERM GOAL #3   Title report no difficulty with dressing or doing her hair    Status Partially Met                 Plan - 02/29/20 1734    Clinical Impression Statement Patient had a fall last week and hurt her left ribs, she overall though reports that she is feeling much better since starting PT.  She has a lot of difficulty with core activation, needing verbal and tactile cues.    PT Next Visit Plan continue shoulder rehab; incorporate LB ex's/flexibility    Consulted and Agree with Plan of Care Patient           Patient will benefit from skilled therapeutic intervention in order to improve the following deficits and impairments:  Abnormal gait, Decreased range of motion, Difficulty walking, Increased muscle spasms, Decreased activity tolerance, Pain, Improper body mechanics, Impaired flexibility, Decreased strength  Visit Diagnosis: Chronic right-sided low back pain with  right-sided sciatica  Acute pain of right shoulder  Stiffness of right shoulder, not elsewhere classified  Acute left-sided low back pain with left-sided sciatica  Muscle weakness (generalized)     Problem List Patient Active Problem List   Diagnosis Date Noted  . Rotator cuff tear arthropathy, right 12/27/2019  . Primary osteoarthritis of both hands 12/27/2019  . Lumbar spondylosis 12/27/2019  . Depression 05/03/2017  . Type 2 diabetes mellitus without complication, without long-term current use of insulin ([place]) 04/22/2017  . Essential hypertension 04/22/2017  . Language barrier to communication 04/22/2017  . Glucosuria 04/22/2017    Sumner Boast., PT 02/29/2020, 5:47 PM  [place] [address] [place], [place], [address] Phone: [phone]   Fax:  [phone]  Name: Sara Harper MRN: [id number] Date of Birth: 1945-11-22

## 2020-03-05 ENCOUNTER — Other Ambulatory Visit: Payer: Self-pay

## 2020-03-05 ENCOUNTER — Ambulatory Visit (INDEPENDENT_AMBULATORY_CARE_PROVIDER_SITE_OTHER): Payer: Medicare Other | Admitting: Nurse Practitioner

## 2020-03-05 ENCOUNTER — Encounter: Payer: Self-pay | Admitting: Nurse Practitioner

## 2020-03-05 VITALS — BP 132/90 | HR 78 | Temp 97.9°F | Ht 59.0 in | Wt 147.0 lb

## 2020-03-05 DIAGNOSIS — E119 Type 2 diabetes mellitus without complications: Secondary | ICD-10-CM | POA: Diagnosis not present

## 2020-03-05 DIAGNOSIS — I10 Essential (primary) hypertension: Secondary | ICD-10-CM | POA: Diagnosis not present

## 2020-03-05 DIAGNOSIS — R079 Chest pain, unspecified: Secondary | ICD-10-CM

## 2020-03-05 DIAGNOSIS — M5441 Lumbago with sciatica, right side: Secondary | ICD-10-CM | POA: Diagnosis not present

## 2020-03-05 DIAGNOSIS — R351 Nocturia: Secondary | ICD-10-CM | POA: Diagnosis not present

## 2020-03-05 DIAGNOSIS — E785 Hyperlipidemia, unspecified: Secondary | ICD-10-CM

## 2020-03-05 LAB — POCT GLYCOSYLATED HEMOGLOBIN (HGB A1C)
HbA1c POC (<> result, manual entry): 6.4 % (ref 4.0–5.6)
HbA1c, POC (controlled diabetic range): 6.4 % (ref 0.0–7.0)
HbA1c, POC (prediabetic range): 6.4 % (ref 5.7–6.4)
Hemoglobin A1C: 6.4 % — AB (ref 4.0–5.6)

## 2020-03-05 LAB — POCT URINALYSIS DIPSTICK OB
Bilirubin, UA: NEGATIVE
Glucose, UA: NEGATIVE
Ketones, UA: NEGATIVE
Leukocytes, UA: NEGATIVE
Nitrite, UA: NEGATIVE
POC,PROTEIN,UA: NEGATIVE
Spec Grav, UA: 1.015 (ref 1.010–1.025)
Urobilinogen, UA: 0.2 E.U./dL
pH, UA: 6 (ref 5.0–8.0)

## 2020-03-05 MED ORDER — ENALAPRIL MALEATE 10 MG PO TABS: 10.0000 mg | ORAL_TABLET | Freq: Every day | ORAL | 3 refills | Status: AC

## 2020-03-05 MED ORDER — METFORMIN HCL 500 MG PO TABS: 500.0000 mg | ORAL_TABLET | Freq: Two times a day (BID) | ORAL | 3 refills | Status: AC

## 2020-03-05 MED ORDER — CYCLOBENZAPRINE HCL 5 MG PO TABS: 5.0000 mg | ORAL_TABLET | Freq: Three times a day (TID) | ORAL | 1 refills | Status: DC | PRN

## 2020-03-05 MED ORDER — GLYBURIDE 5 MG PO TABS: 5.0000 mg | ORAL_TABLET | Freq: Every day | ORAL | 3 refills | Status: AC

## 2020-03-05 MED ORDER — CYCLOBENZAPRINE HCL 5 MG PO TABS: 5.0000 mg | ORAL_TABLET | Freq: Two times a day (BID) | ORAL | 0 refills | Status: DC | PRN

## 2020-03-05 NOTE — Progress Notes (Signed)
[facility] Patient [facility] [address] [place], [place]  [address] Phone:  [phone]   Fax:  [phone]   Established Patient Office Visit  Subjective:  Patient ID: Sara Harper, female    DOB: Dec 02, 1945  Age: 74 y.o. MRN: [id number]  CC:  Chief Complaint  Patient presents with  . Follow-up    HPI Sara Harper presents for chest pain. She  has a past medical history of Diabetes mellitus without complication (HCC), High cholesterol, and Hypertension.   Chest Pain Sara Harper complains of chest pain. Onset was 1 month ago. Symptoms have worsened since that time. The patient's pain is associated with activities. The patient describes the pain as stabbing and radiates to the left middle back. She denies pain right now Patient rates pain as a 6/10 in intensity. Associated symptoms are: chest pain, fatigue and paroxysmal nocturnal dyspnea. Aggravating factors are: deep inspiration, walking and sitting and lying. Alleviating factors are: NSAIDs and topical cream. Patient's cardiac risk factors are: advanced age (older than 16 for men, 50 for women), diabetes mellitus, dyslipidemia, hypertension, obesity (BMI >= 30 kg/m2) and sedentary lifestyle. Patient's risk factors for DVT/PE: none. Previous cardiac testing: chest x-ray, cholesterol, electrocardiogram (ECG) and urinalysis. She admits that she suffered a fall about 2 weeks ago.   Diabetes Mellitus Patient presents for follow up of diabetes. Current symptoms include: hyperglycemia and paresthesia of the feet. Symptoms have stabilized. Patient denies foot ulcerations, hypoglycemia , increased appetite, nausea, polydipsia, polyuria and vomiting. Evaluation to date has included: fasting blood sugar, fasting lipid panel, hemoglobin A1C and microalbuminuria.  Home sugars: 2 weeks CBG was 270 now CBG 140. Current treatment: Continued sulfonylurea which has been effective, Continued metformin which has been effective and Continued statin  which has been effective.  Past Medical History:  Diagnosis Date  . Diabetes mellitus without complication (HCC)   . High cholesterol   . Hypertension     Past Surgical History:  Procedure Laterality Date  . ABDOMINAL HYSTERECTOMY    . CHOLECYSTECTOMY      Family History  Problem Relation Age of Onset  . Cancer Mother   . Heart failure Father     Social History   Socioeconomic History  . Marital status: Divorced    Spouse name: Not on file  . Number of children: Not on file  . Years of education: Not on file  . Highest education level: Not on file  Occupational History  . Not on file  Tobacco Use  . Smoking status: Never Smoker  . Smokeless tobacco: Never Used  Vaping Use  . Vaping Use: Never used  Substance and Sexual Activity  . Alcohol use: No    Alcohol/week: 0.0 standard drinks  . Drug use: No  . Sexual activity: Not on file  Other Topics Concern  . Not on file  Social History Narrative  . Not on file   Social Determinants of Health   Financial Resource Strain:   . Difficulty of Paying Living Expenses:   Food Insecurity:   . Worried About Programme researcher, broadcasting/film/video in the Last Year:   . Barista in the Last Year:   Transportation Needs:   . Freight forwarder (Medical):   Marland Kitchen Lack of Transportation (Non-Medical):   Physical Activity:   . Days of Exercise per Week:   . Minutes of Exercise per Session:   Stress:   . Feeling of Stress :   Social Connections:   .  Frequency of Communication with Friends and Family:   . Frequency of Social Gatherings with Friends and Family:   . Attends Religious Services:   . Active Member of Clubs or Organizations:   . Attends BankerClub or Organization Meetings:   Marland Kitchen. Marital Status:   Intimate Partner Violence:   . Fear of Current or Ex-Partner:   . Emotionally Abused:   Marland Kitchen. Physically Abused:   . Sexually Abused:     Outpatient Medications Prior to Visit  Medication Sig Dispense Refill  . cetirizine (ZYRTEC  ALLERGY) 10 MG tablet Take 1 tablet (10 mg total) by mouth daily. 90 tablet 0  . Melatonin 10 MG CAPS Take 20 mg by mouth at bedtime as needed. May start with 10mg  60 capsule 2  . meloxicam (MOBIC) 15 MG tablet One tab PO qAM with a meal for 2 weeks, then daily prn pain. 30 tablet 3  . simvastatin (ZOCOR) 10 MG tablet Take 1 tablet (10 mg total) by mouth every evening. 30 tablet 11  . triamcinolone cream (KENALOG) 0.1 % Apply 1 application topically 2 (two) times daily. 456.6 g 0  . cyclobenzaprine (FLEXERIL) 5 MG tablet Take 1 tablet (5 mg total) by mouth 2 (two) times daily as needed for muscle spasms. 10 tablet 0  . enalapril (VASOTEC) 10 MG tablet Take 1 tablet (10 mg total) by mouth daily. 90 tablet 0  . glyBURIDE (DIABETA) 5 MG tablet Take 1 tablet (5 mg total) by mouth daily with breakfast. 90 tablet 0  . metFORMIN (GLUCOPHAGE) 500 MG tablet Take 1 tablet (500 mg total) by mouth 2 (two) times daily with a meal. 180 tablet 0  . gabapentin (NEURONTIN) 300 MG capsule Take 1 capsule (300 mg total) by mouth 3 (three) times daily. 270 capsule 0   No facility-administered medications prior to visit.    No Known Allergies  ROS Review of Systems    Objective:    Physical Exam Vitals reviewed. Exam conducted with a chaperone present.  Constitutional:      General: She is not in acute distress.    Appearance: Normal appearance. She is not ill-appearing, toxic-appearing or diaphoretic.  HENT:     Head: Normocephalic and atraumatic.     Nose: Nose normal.     Mouth/Throat:     Mouth: Mucous membranes are moist.     Pharynx: Oropharynx is clear.  Eyes:     Pupils: Pupils are equal, round, and reactive to light.  Cardiovascular:     Rate and Rhythm: Normal rate and regular rhythm.     Pulses: Normal pulses.     Heart sounds: Normal heart sounds.  Pulmonary:     Effort: Pulmonary effort is normal.     Breath sounds: Normal breath sounds.  Abdominal:     General: Bowel sounds are  normal.     Palpations: Abdomen is soft.  Musculoskeletal:        General: Normal range of motion.     Cervical back: Normal range of motion.  Skin:    General: Skin is warm and dry.  Neurological:     General: No focal deficit present.     Mental Status: She is alert and oriented to person, place, and time.  Psychiatric:        Mood and Affect: Mood normal.        Behavior: Behavior normal.        Thought Content: Thought content normal.        Judgment:  Judgment normal.     BP 132/90   Pulse 78   Temp 97.9 F (36.6 C)   Wt 147 lb (66.7 kg)   SpO2 98%   BMI 29.69 kg/m  Wt Readings from Last 3 Encounters:  03/05/20 147 lb (66.7 kg)  12/28/19 144 lb (65.3 kg)  12/07/19 144 lb 6.4 oz (65.5 kg)     Health Maintenance Due  Topic Date Due  . Hepatitis C Screening  Never done  . OPHTHALMOLOGY EXAM  Never done  . COLONOSCOPY  Never done  . DEXA SCAN  Never done  . FOOT EXAM  05/21/2018  . PNA vac Low Risk Adult (2 of 2 - PPSV23) 05/21/2018    There are no preventive care reminders to display for this patient.  Lab Results  Component Value Date   TSH 0.519 09/12/2019   Lab Results  Component Value Date   WBC 5.7 09/12/2019   HGB 13.1 09/12/2019   HCT 37.8 09/12/2019   MCV 93 09/12/2019   PLT 252 09/12/2019   Lab Results  Component Value Date   NA 140 03/05/2020   K 4.0 03/05/2020   CO2 25 04/20/2017   GLUCOSE 86 03/05/2020   BUN 14 03/05/2020   CREATININE 0.69 03/05/2020   BILITOT 0.2 03/05/2020   ALKPHOS 48 03/05/2020   AST 14 03/05/2020   PROT 6.7 03/05/2020   ALBUMIN 4.5 03/05/2020   CALCIUM 9.7 03/05/2020   Lab Results  Component Value Date   CHOL 213 (H) 09/13/2019   Lab Results  Component Value Date   HDL 46 09/13/2019   Lab Results  Component Value Date   LDLCALC 137 (H) 09/13/2019   Lab Results  Component Value Date   TRIG 169 (H) 09/13/2019   Lab Results  Component Value Date   CHOLHDL 4.6 (H) 09/13/2019   Lab Results    Component Value Date   HGBA1C 6.4 (A) 03/05/2020   HGBA1C 6.4 03/05/2020   HGBA1C 6.4 03/05/2020   HGBA1C 6.4 03/05/2020      Assessment & Plan:   Problem List Items Addressed This Visit      Cardiovascular and Mediastinum   Essential hypertension   Relevant Medications   enalapril (VASOTEC) 10 MG tablet   Other Relevant Orders   Comp. Metabolic Panel (12) (Completed)     Endocrine   Type 2 diabetes mellitus without complication, without long-term current use of insulin (HCC)   Relevant Medications   enalapril (VASOTEC) 10 MG tablet   glyBURIDE (DIABETA) 5 MG tablet   metFORMIN (GLUCOPHAGE) 500 MG tablet   Other Relevant Orders   POCT HgB A1C (Completed)    Other Visit Diagnoses    Chest pain, unspecified type    -  Primary Encourage patient and daughter if symptoms persist or get worse she is to go to the emergency room.  Discussed the importance of emergency room visit with chest pains especially with her medical history   Relevant Orders   EKG 12-Lead   Ambulatory referral to Cardiology   Magnesium (Completed)   Dyslipidemia Dietary recommendations: Mediterranean diet guidelines Reduce saturated fat, "trans" monounsaturated fatty acids, and cholesterol Exercise recommendations:  At least 25 minutes of vigorous aerobic activity at least 3 days per week for a total of 75 minutes Other treatment: Treatment of diabetes (Metformin and glyburide) Treatment of hypertension (Enalapril)         Return in about 3 months (around 06/05/2020).      Nocturia  Relevant Orders   POC Urinalysis Dipstick OB (Completed)   Urine Culture      Meds ordered this encounter  Medications  . enalapril (VASOTEC) 10 MG tablet    Sig: Take 1 tablet (10 mg total) by mouth daily.    Dispense:  90 tablet    Refill:  3    Order Specific Question:   Supervising Provider    Answer:   Quentin Angst [id number]  . glyBURIDE (DIABETA) 5 MG tablet    Sig: Take 1 tablet (5 mg  total) by mouth daily with breakfast.    Dispense:  90 tablet    Refill:  3    Order Specific Question:   Supervising Provider    Answer:   Quentin Angst [id number]  . metFORMIN (GLUCOPHAGE) 500 MG tablet    Sig: Take 1 tablet (500 mg total) by mouth 2 (two) times daily with a meal.    Dispense:  180 tablet    Refill:  3    Order Specific Question:   Supervising Provider    Answer:   Quentin Angst [id number]  . DISCONTD: cyclobenzaprine (FLEXERIL) 5 MG tablet    Sig: Take 1 tablet (5 mg total) by mouth 2 (two) times daily as needed for muscle spasms.    Dispense:  10 tablet    Refill:  0    Order Specific Question:   Supervising Provider    Answer:   Quentin Angst [id number]  . cyclobenzaprine (FLEXERIL) 5 MG tablet    Sig: Take 1 tablet (5 mg total) by mouth 3 (three) times daily as needed for muscle spasms.    Dispense:  30 tablet    Refill:  1    Order Specific Question:   Supervising Provider    Answer:   Quentin Angst [id number]    Follow-up: Return in about 3 months (around 06/05/2020).    Barbette Merino, NP

## 2020-03-06 ENCOUNTER — Encounter: Payer: Self-pay | Admitting: Nurse Practitioner

## 2020-03-06 LAB — COMP. METABOLIC PANEL (12)
AST: 14 IU/L (ref 0–40)
Albumin/Globulin Ratio: 2 (ref 1.2–2.2)
Albumin: 4.5 g/dL (ref 3.7–4.7)
Alkaline Phosphatase: 48 IU/L (ref 48–121)
BUN/Creatinine Ratio: 20 (ref 12–28)
BUN: 14 mg/dL (ref 8–27)
Bilirubin Total: 0.2 mg/dL (ref 0.0–1.2)
Calcium: 9.7 mg/dL (ref 8.7–10.3)
Chloride: 101 mmol/L (ref 96–106)
Creatinine, Ser: 0.69 mg/dL (ref 0.57–1.00)
GFR calc Af Amer: 101 mL/min/{1.73_m2} (ref >59)
GFR calc non Af Amer: 88 mL/min/{1.73_m2} (ref >59)
Globulin, Total: 2.2 g/dL (ref 1.5–4.5)
Glucose: 86 mg/dL (ref 65–99)
Potassium: 4 mmol/L (ref 3.5–5.2)
Sodium: 140 mmol/L (ref 134–144)
Total Protein: 6.7 g/dL (ref 6.0–8.5)

## 2020-03-06 LAB — MAGNESIUM: Magnesium: 2 mg/dL (ref 1.6–2.3)

## 2020-03-07 LAB — URINE CULTURE

## 2020-03-08 ENCOUNTER — Other Ambulatory Visit: Payer: Self-pay

## 2020-03-08 ENCOUNTER — Ambulatory Visit: Payer: Medicare Other | Admitting: Physical Therapy

## 2020-03-08 ENCOUNTER — Ambulatory Visit: Payer: Medicaid Other | Admitting: Nurse Practitioner

## 2020-03-08 DIAGNOSIS — G8929 Other chronic pain: Secondary | ICD-10-CM

## 2020-03-08 DIAGNOSIS — M6281 Muscle weakness (generalized): Secondary | ICD-10-CM

## 2020-03-08 DIAGNOSIS — M5441 Lumbago with sciatica, right side: Secondary | ICD-10-CM

## 2020-03-08 NOTE — Therapy (Signed)
[place], [place], [address] Phone: [phone]   Fax:  [phone]  Physical Therapy Treatment  Patient Details  Name: Sara Harper MRN: [id number] Date of Birth: 07/23/1945 Referring Provider (PT): Edison Pace   Encounter Date: 03/08/2020   PT End of Session - 03/08/20 1655    Visit Number 7    Date for PT Re-Evaluation 03/11/20    PT Start Time 1610    PT Stop Time 1650    PT Time Calculation (min) 40 min           Past Medical History:  Diagnosis Date  . Diabetes mellitus without complication ([place])   . High cholesterol   . Hypertension     Past Surgical History:  Procedure Laterality Date  . ABDOMINAL HYSTERECTOMY    . CHOLECYSTECTOMY      There were no vitals filed for this visit.   Subjective Assessment - 03/08/20 1615    Subjective still sore in left chest wall ,saw MD yesterday and all okay. back is better but pins and needles at time down RT leg. shld is good no pian and stronger                             [facility] Adult PT Treatment/Exercise - 03/08/20 0001      Lumbar Exercises: Aerobic   Nustep L 4 50mn      Lumbar Exercises: Machines for Strengthening   Cybex Lumbar Extension black tband 10x,trunk flexion 10 x      Lumbar Exercises: Standing   Other Standing Lumbar Exercises red tband hip ext and abd 10 each leg    Other Standing Lumbar Exercises row 10# 10x,lat pull 15# 10 x      Lumbar Exercises: Supine   Other Supine Lumbar Exercises core stab x 10 min      Shoulder Exercises: Standing   External Rotation Strengthening;Both;15 reps;Theraband   verb and tactile cuing   Theraband Level (Shoulder External Rotation) Level 2 (Red)    ABduction Strengthening;Both;15 reps;Theraband    Theraband Level (Shoulder ABduction) Level 2 (Red)    Extension Strengthening;Both;15 reps;Theraband    Theraband Level (Shoulder Extension) Level 2 (Red)    Row  Strengthening;Both;15 reps    Theraband Level (Shoulder Row) Level 2 (Red)      Manual Therapy   Manual Therapy Passive ROM    Passive ROM LE's and trunk added quad. piriformis   added RT LE distraction                   PT Short Term Goals - 02/29/20 1744      PT SHORT TERM GOAL #1   Title independent with initial HEP    Status Achieved             PT Long Term Goals - 03/08/20 1654      PT LONG TERM GOAL #1   Title understand posture and body mechanics    Status Partially Met      PT LONG TERM GOAL #2   Title decrease shoulder pain 50%    Status Partially Met      PT LONG TERM GOAL #3   Title report no difficulty with dressing or doing her hair    Status Partially Met      PT LONG TERM GOAL #4   Title increase right shoulder IR to 65 degrees  Status Achieved      PT LONG TERM GOAL #5   Title decrease LBP 50%    Baseline issue is with prolonged walking increases pain and N?t down RT leg that goes away with sitting    Status Partially Met                 Plan - 03/08/20 1655    Clinical Impression Statement overall pt reports feeling better, back is much better just N/T with some pain with prolonged walking that stops with rest. pt has no c/o shdl pain. pt c/o left chest wall pain but f/u with MD and no issues. working on postural and core strength- pt does need cuing verb and tactile and shows fatigue    PT Treatment/Interventions ADLs/Self Care Home Management;Electrical Stimulation;Cryotherapy;Iontophoresis 57m/ml Dexamethasone;Moist Heat;Traction;Ultrasound;Stair training;Functional mobility training;Therapeutic activities;Therapeutic exercise;Neuromuscular re-education;Manual techniques;Patient/family education;Passive range of motion;Dry needling;Balance training    PT Next Visit Plan core strength           Patient will benefit from skilled therapeutic intervention in order to improve the following deficits and impairments:  Abnormal  gait, Decreased range of motion, Difficulty walking, Increased muscle spasms, Decreased activity tolerance, Pain, Improper body mechanics, Impaired flexibility, Decreased strength  Visit Diagnosis: Chronic right-sided low back pain with right-sided sciatica  Muscle weakness (generalized)     Problem List Patient Active Problem List   Diagnosis Date Noted  . Rotator cuff tear arthropathy, right 12/27/2019  . Primary osteoarthritis of both hands 12/27/2019  . Lumbar spondylosis 12/27/2019  . Depression 05/03/2017  . Type 2 diabetes mellitus without complication, without long-term current use of insulin ([place] 04/22/2017  . Essential hypertension 04/22/2017  . Language barrier to communication 04/22/2017  . Glucosuria 04/22/2017    Ngai Parcell,ANGIE PTA 03/08/2020, 4:59 PM  [place] [address]Phone: [phone]  Fax:  [phone] Name: Sara VillamarMRN: [id number] of Birth: 104-29-1947

## 2020-03-09 ENCOUNTER — Telehealth: Payer: Self-pay | Admitting: Nurse Practitioner

## 2020-03-09 NOTE — Telephone Encounter (Signed)
Pt called stated she needed med refill for metformin myself and C king both check med refill was sent on 03/05/2020 for a yr worth of refill Pt hung up phone on me but before she did she stated she has called the pharmacy and her meds where not there Pt seemed to be upset but meds have been sent on 03/05/2020

## 2020-03-22 ENCOUNTER — Other Ambulatory Visit: Payer: Self-pay

## 2020-03-22 ENCOUNTER — Ambulatory Visit: Payer: Medicare Other | Attending: Sports Medicine | Admitting: Physical Therapy

## 2020-03-22 DIAGNOSIS — M6281 Muscle weakness (generalized): Secondary | ICD-10-CM | POA: Insufficient documentation

## 2020-03-22 NOTE — Therapy (Signed)
[place], [place], [address] Phone: [phone]   Fax:  [phone]  Physical Therapy Treatment  Patient Details  Name: Sara Harper MRN: [id number] Date of Birth: Feb 13, 1946 Referring Provider (PT): Edison Pace   Encounter Date: 03/22/2020   PT End of Session - 03/22/20 1611    Visit Number 8    PT Start Time 8182    PT Stop Time 9937    PT Time Calculation (min) 39 min           Past Medical History:  Diagnosis Date  . Diabetes mellitus without complication ([place])   . High cholesterol   . Hypertension     Past Surgical History:  Procedure Laterality Date  . ABDOMINAL HYSTERECTOMY    . CHOLECYSTECTOMY      There were no vitals filed for this visit.   Subjective Assessment - 03/22/20 1610    Subjective doing much better, when it is cool weather pain from arthritis in back acts up. shld is good    Patient is accompained by: Family member;Interpreter    Currently in Pain? No/denies                             [facility] Adult PT Treatment/Exercise - 03/22/20 0001      Lumbar Exercises: Aerobic   Nustep L 5 6 min      Lumbar Exercises: Machines for Strengthening   Cybex Lumbar Extension black tband 15x,trunk flexion 15 x    Other Lumbar Machine Exercise row and lats 15# 2 sets 15      Lumbar Exercises: Seated   Sit to Stand 15 reps   with wt ball chest press   Other Seated Lumbar Exercises seated obl with wt ball 10 each      Lumbar Exercises: Supine   Ab Set 15 reps;3 seconds    Clam --   green tband   Bent Knee Raise 20 reps   green tband   Bridge Non-compliant;15 reps;3 seconds   obliques with feet on ball     Manual Therapy   Manual Therapy Passive ROM    Passive ROM LE's and trunk added quad. piriformis                  PT Education - 03/22/20 1696    Education Details reviewed HEPs and stretching for home    Person(s) Educated Patient;Other  (comment)   interpreter   Methods Explanation;Demonstration    Comprehension Verbalized understanding            PT Short Term Goals - 02/29/20 1744      PT SHORT TERM GOAL #1   Title independent with initial HEP    Status Achieved             PT Long Term Goals - 03/22/20 1611      PT LONG TERM GOAL #1   Title understand posture and body mechanics    Status Achieved      PT LONG TERM GOAL #2   Title decrease shoulder pain 50%    Status Achieved      PT LONG TERM GOAL #3   Title report no difficulty with dressing or doing her hair    Status Achieved      PT LONG TERM GOAL #4   Title increase right shoulder IR to 65 degrees    Status Achieved  PT LONG TERM GOAL #5   Title decrease LBP 50%    Status Achieved                 Plan - 03/22/20 1645    Clinical Impression Statement all goals met. BIL shld   = in ROM and strength. lumbar ROM WNLS LE and core weakness noted bt fatigue and shaking iwth ex.pt concerned with weakness but leaving for [place]- explined to pt to continue with HEP and if needs more therpay after her return to f/u with MD.all goals met    PT Treatment/Interventions ADLs/Self Care Home Management;Electrical Stimulation;Cryotherapy;Iontophoresis 4mg /ml Dexamethasone;Moist Heat;Traction;Ultrasound;Stair training;Functional mobility training;Therapeutic activities;Therapeutic exercise;Neuromuscular re-education;Manual techniques;Patient/family education;Passive range of motion;Dry needling;Balance training    PT Next Visit Plan D/C           Patient will benefit from skilled therapeutic intervention in order to improve the following deficits and impairments:  Abnormal gait, Decreased range of motion, Difficulty walking, Increased muscle spasms, Decreased activity tolerance, Pain, Improper body mechanics, Impaired flexibility, Decreased strength  Visit Diagnosis: Muscle weakness (generalized)     Problem List Patient Active Problem  List   Diagnosis Date Noted  . Rotator cuff tear arthropathy, right 12/27/2019  . Primary osteoarthritis of both hands 12/27/2019  . Lumbar spondylosis 12/27/2019  . Depression 05/03/2017  . Type 2 diabetes mellitus without complication, without long-term current use of insulin ([place]) 04/22/2017  . Essential hypertension 04/22/2017  . Language barrier to communication 04/22/2017  . Glucosuria 04/22/2017   PHYSICAL THERAPY DISCHARGE SUMMARY   Plan: Patient agrees to discharge.  Patient goals were met. Patient is being discharged due to meeting the stated rehab goals.  ?????      Tahira Olivarez,ANGIE PTA 03/22/2020, 4:51 PM  [place], [place], [address] Phone: [phone]   Fax:  [phone]  Name: Sara Harper MRN: [id number] Date of Birth: 11/05/1945

## 2020-03-29 ENCOUNTER — Ambulatory Visit: Payer: Medicare Other | Admitting: Physical Therapy

## 2020-04-02 ENCOUNTER — Encounter (HOSPITAL_COMMUNITY): Payer: Self-pay

## 2020-04-02 ENCOUNTER — Ambulatory Visit (HOSPITAL_COMMUNITY)
Admission: EM | Admit: 2020-04-02 | Discharge: 2020-04-02 | Disposition: A | Discharge status: Home / Self Care | Payer: Medicare Other | Attending: Physician Assistant | Admitting: Physician Assistant

## 2020-04-02 ENCOUNTER — Other Ambulatory Visit: Payer: Self-pay

## 2020-04-02 ENCOUNTER — Ambulatory Visit (INDEPENDENT_AMBULATORY_CARE_PROVIDER_SITE_OTHER): Payer: Medicare Other | Admitting: Internal Medicine

## 2020-04-02 ENCOUNTER — Encounter: Payer: Self-pay | Admitting: Internal Medicine

## 2020-04-02 VITALS — BP 136/60 | HR 91 | Ht 59.0 in | Wt 146.0 lb

## 2020-04-02 DIAGNOSIS — J069 Acute upper respiratory infection, unspecified: Secondary | ICD-10-CM | POA: Diagnosis present

## 2020-04-02 DIAGNOSIS — Z1152 Encounter for screening for COVID-19: Secondary | ICD-10-CM | POA: Insufficient documentation

## 2020-04-02 DIAGNOSIS — I10 Essential (primary) hypertension: Secondary | ICD-10-CM

## 2020-04-02 DIAGNOSIS — Z01810 Encounter for preprocedural cardiovascular examination: Secondary | ICD-10-CM | POA: Diagnosis not present

## 2020-04-02 DIAGNOSIS — E1159 Type 2 diabetes mellitus with other circulatory complications: Secondary | ICD-10-CM | POA: Diagnosis not present

## 2020-04-02 DIAGNOSIS — I152 Hypertension secondary to endocrine disorders: Secondary | ICD-10-CM

## 2020-04-02 DIAGNOSIS — M1990 Unspecified osteoarthritis, unspecified site: Secondary | ICD-10-CM

## 2020-04-02 DIAGNOSIS — R079 Chest pain, unspecified: Secondary | ICD-10-CM | POA: Diagnosis not present

## 2020-04-02 DIAGNOSIS — E7849 Other hyperlipidemia: Secondary | ICD-10-CM | POA: Insufficient documentation

## 2020-04-02 MED ORDER — METOPROLOL TARTRATE 100 MG PO TABS: ORAL_TABLET | ORAL | 0 refills | Status: AC

## 2020-04-02 MED ORDER — BENZONATATE 100 MG PO CAPS: 100.0000 mg | ORAL_CAPSULE | Freq: Three times a day (TID) | ORAL | 0 refills | Status: AC | PRN

## 2020-04-02 MED ORDER — ALBUTEROL SULFATE HFA 108 (90 BASE) MCG/ACT IN AERS: 1.0000 | INHALATION_SPRAY | Freq: Four times a day (QID) | RESPIRATORY_TRACT | 0 refills | Status: AC | PRN

## 2020-04-02 NOTE — ED Triage Notes (Signed)
Pt presents with non productive cough, and sinus pressure X 2 days.

## 2020-04-02 NOTE — Progress Notes (Signed)
Cardiology Office Note:    Date:  04/02/2020   ID:  Timoteo Gaul, DOB [birth year], MRN [id number]  PCP:  Barbette Merino, NP  [facility] Cardiologist:  No primary care provider on file.  [facility] Electrophysiologist:  None   Referring MD: Barbette Merino, NP   Chest Pain Seen in consultation for the evaluation of chest pain at the request of Ms. Thad Ranger.  Interpretor present throughout evaluation.  History of Present Illness:    Sara Harper is a 74 y.o. female with a hx of T2DM with hypertension, HLD, and arthritis who presents to follow up for chest pain.  Patient was with family in August 2021 at the pool.  Patient tripped over something when leaving the restroom.  She tripped over something small and fell on her side.  Patient notice three weeks of constant chest pain, worsened with cough or position. Pain improved over time.  When walking has pain in her back and leg. No shortness of breath, no syncope.  Pain had this pain in [place] for a few days (last year).  Also associated with a fall.   After significant questioning patient notes:  Since 2020, patient has noted that she has had episodes of chest pain that surrounds her chest.  Unclear the trigger but they affect her chest and arm.  Rest makes this better; she has not had an escalation of these symptoms, but rather they come and go.  She is unclear how exercise relates to them and notes that her leg and back pain limit strenuous activity (has had to injections in her back).   Past Medical History:  Diagnosis Date  . Diabetes mellitus without complication (HCC)   . High cholesterol   . Hypertension     Past Surgical History:  Procedure Laterality Date  . ABDOMINAL HYSTERECTOMY    . CHOLECYSTECTOMY      Current Medications: Current Meds  Medication Sig  . enalapril (VASOTEC) 10 MG tablet Take 1 tablet (10 mg total) by mouth daily.  Marland Kitchen gabapentin (NEURONTIN) 300 MG capsule Take 1 capsule (300 mg total) by  mouth 3 (three) times daily.  Marland Kitchen glyBURIDE (DIABETA) 5 MG tablet Take 1 tablet (5 mg total) by mouth daily with breakfast.  . metFORMIN (GLUCOPHAGE) 500 MG tablet Take 1 tablet (500 mg total) by mouth 2 (two) times daily with a meal.    Unclear whether or statin or not.  Allergies:   Patient has no known allergies.   Social History   Socioeconomic History  . Marital status: Divorced    Spouse name: Not on file  . Number of children: Not on file  . Years of education: Not on file  . Highest education level: Not on file  Occupational History  . Not on file  Tobacco Use  . Smoking status: Never Smoker  . Smokeless tobacco: Never Used  Vaping Use  . Vaping Use: Never used  Substance and Sexual Activity  . Alcohol use: No    Alcohol/week: 0.0 standard drinks  . Drug use: No  . Sexual activity: Not on file  Other Topics Concern  . Not on file  Social History Narrative  . Not on file   Social Determinants of Health   Financial Resource Strain:   . Difficulty of Paying Living Expenses: Not on file  Food Insecurity:   . Worried About Programme researcher, broadcasting/film/video in the Last Year: Not on file  . Ran Out of Food in the Last  Year: Not on file  Transportation Needs:   . Lack of Transportation (Medical): Not on file  . Lack of Transportation (Non-Medical): Not on file  Physical Activity:   . Days of Exercise per Week: Not on file  . Minutes of Exercise per Session: Not on file  Stress:   . Feeling of Stress : Not on file  Social Connections:   . Frequency of Communication with Friends and Family: Not on file  . Frequency of Social Gatherings with Friends and Family: Not on file  . Attends Religious Services: Not on file  . Active Member of Clubs or Organizations: Not on file  . Attends Banker Meetings: Not on file  . Marital Status: Not on file     Family History: The patient's family history includes Cancer in her mother; Heart failure in her father. Father died from  heart attack at age 60.  Need PPM at age 36.  ROS:   Please see the history of present illness.    Note arthritic pain in her legs and back. All other systems reviewed and are negative.  EKGs/Labs/Other Studies Reviewed:    The following studies were reviewed today:  EKG:  EKG is ordered today.  The ekg ordered today demonstrates sinus rhythm rate 86, RBBB with LAFB (BiFasciular Block) similar to 8/16 (ordered in rooming pt; unclear active CP) 03/05/20- sinus rhythm RBBB, LAD, LVH Recent Labs: 09/12/2019: Hemoglobin 13.1; Platelets 252; TSH 0.519 03/05/2020: BUN 14; Creatinine, Ser 0.69; Magnesium 2.0; Potassium 4.0; Sodium 140  Recent Lipid Panel    Component Value Date/Time   CHOL 213 (H) 09/13/2019 0824   TRIG 169 (H) 09/13/2019 0824   HDL 46 09/13/2019 0824   CHOLHDL 4.6 (H) 09/13/2019 0824   LDLCALC 137 (H) 09/13/2019 0824   A1c 6.4 03/05/20 Creatinine 0.69 03/05/20  Physical Exam:    VS:  BP 136/60   Pulse 91   Ht 4\' 11"  (1.499 m)   Wt 146 lb (66.2 kg)   SpO2 98%   BMI 29.49 kg/m     Wt Readings from Last 3 Encounters:  04/02/20 146 lb (66.2 kg)  03/05/20 147 lb (66.7 kg)  12/28/19 144 lb (65.3 kg)    GEN: Elderly, well nourished, well developed in no acute distress HEENT: Normal NECK: No JVD; No carotid bruits LYMPHATICS: No lymphadenopathy CARDIAC: RRR, no murmurs, rubs, gallops; 2+ radial bilaterally RESPIRATORY:  Clear to auscultation without rales, wheezing or rhonchi  ABDOMEN: Soft, non-tender, non-distended MUSCULOSKELETAL:  No edema; No deformity  SKIN: Warm and dry NEUROLOGIC:  Alert and oriented x 3 PSYCHIATRIC:  Normal affect   ASSESSMENT:    1. Other hyperlipidemia   2. Hypertension associated with diabetes (HCC)    PLAN:    In order of problems listed above:  1. Chest Pain Syndrome -Multiple descriptions range from typical to atypical throughout prolonged discussion; with multiple risk factors, changing anginal story, and limited  mobility (arthritis) would recommended CCTA +/- FFR for the evalaution of obstructive CAD; will need BB for HF < 66 bpm 2. Diabetes with Hypertension - continue current medications 3. Hyperlipidemia - presently admits to taking statin with me; not sure in general; may uptitrate terapy after stress testing  Social Determinants of Health: Even correcting for language barriers, concern for health literacy issues (unclear medications that she is actually taking).  Will see in 6 months; discussed the possibility of LCP based on findings; patient is amenable     Medication Adjustments/Labs and  Tests Ordered: Current medicines are reviewed at length with the patient today.  Concerns regarding medicines are outlined above.  No orders of the defined types were placed in this encounter.  No orders of the defined types were placed in this encounter.   There are no Patient Instructions on file for this visit.   Signed, Christell Constant, MD  04/02/2020 4:05 PM    [facility]

## 2020-04-02 NOTE — Discharge Instructions (Addendum)
Return if symptoms worsen.  COVID test pending.

## 2020-04-02 NOTE — ED Provider Notes (Addendum)
[facility]    CSN: [id number] Arrival date & time: 04/02/20  1551      History   Chief Complaint Chief Complaint  Patient presents with  . Cough    HPI Sara Harper is a 74 y.o. female.   Pt complains of non productive cough and sinus pressure that started about three days ago.  Denies fever, chills, n/v/d, wheezing, shortness of breath, sweats.  She has had her COVID vaccine.  Denies ill contacts.  Denies h/o asthma or COPD.  She has been taking Nyquil with relief.  She has used her grandchild's albuterol inhaler with relief.       Past Medical History:  Diagnosis Date  . Diabetes mellitus without complication (HCC)   . High cholesterol   . Hypertension     Patient Active Problem List   Diagnosis Date Noted  . Other hyperlipidemia 04/02/2020  . Rotator cuff tear arthropathy, right 12/27/2019  . Primary osteoarthritis of both hands 12/27/2019  . Lumbar spondylosis 12/27/2019  . Depression 05/03/2017  . Type 2 diabetes mellitus without complication, without long-term current use of insulin (HCC) 04/22/2017  . Hypertension associated with diabetes (HCC) 04/22/2017  . Language barrier to communication 04/22/2017  . Glucosuria 04/22/2017    Past Surgical History:  Procedure Laterality Date  . ABDOMINAL HYSTERECTOMY    . CHOLECYSTECTOMY      OB History   No obstetric history on file.      Home Medications    Prior to Admission medications   Medication Sig Start Date End Date Taking? Authorizing Provider  enalapril (VASOTEC) 10 MG tablet Take 1 tablet (10 mg total) by mouth daily. 03/05/20 03/05/21  Barbette Merino, NP  gabapentin (NEURONTIN) 300 MG capsule Take 1 capsule (300 mg total) by mouth 3 (three) times daily. 10/06/19 04/02/20  Barbette Merino, NP  glyBURIDE (DIABETA) 5 MG tablet Take 1 tablet (5 mg total) by mouth daily with breakfast. 03/05/20 03/05/21  Barbette Merino, NP  metFORMIN (GLUCOPHAGE) 500 MG tablet Take 1 tablet (500 mg total) by  mouth 2 (two) times daily with a meal. 03/05/20 03/05/21  Barbette Merino, NP  metoprolol tartrate (LOPRESSOR) 100 MG tablet Take one tablet (100 mg) by mouth one hour prior to your cardiac CT. 04/02/20   Nahser, Deloris Ping, MD    Family History Family History  Problem Relation Age of Onset  . Cancer Mother   . Heart failure Father     Social History Social History   Tobacco Use  . Smoking status: Never Smoker  . Smokeless tobacco: Never Used  Vaping Use  . Vaping Use: Never used  Substance Use Topics  . Alcohol use: No    Alcohol/week: 0.0 standard drinks  . Drug use: No     Allergies   Patient has no known allergies.   Review of Systems Review of Systems  Constitutional: Negative for chills, diaphoresis and fever.  HENT: Positive for sinus pressure. Negative for congestion, ear pain and sore throat.   Eyes: Negative for pain and visual disturbance.  Respiratory: Positive for cough. Negative for shortness of breath and wheezing.   Cardiovascular: Negative for chest pain and palpitations.  Gastrointestinal: Negative for abdominal pain, diarrhea, nausea and vomiting.  Genitourinary: Negative for dysuria and hematuria.  Musculoskeletal: Negative for arthralgias and back pain.  Skin: Negative for color change and rash.  Neurological: Negative for seizures and syncope.  All other systems reviewed and are negative.    Physical  Exam Triage Vital Signs ED Triage Vitals [04/02/20 1831]  Enc Vitals Group     BP (!) 141/81     Pulse Rate 80     Resp 17     Temp 98.4 F (36.9 C)     Temp Source Oral     SpO2 95 %     Weight      Height      Head Circumference      Peak Flow      Pain Score 5     Pain Loc      Pain Edu?      Excl. in GC?    No data found.  Updated Vital Signs BP (!) 141/81 (BP Location: Right Arm)   Pulse 80   Temp 98.4 F (36.9 C) (Oral)   Resp 17   SpO2 95%   Visual Acuity Right Eye Distance:   Left Eye Distance:   Bilateral Distance:      Right Eye Near:   Left Eye Near:    Bilateral Near:     Physical Exam Vitals and nursing note reviewed.  Constitutional:      General: She is not in acute distress.    Appearance: She is well-developed.  HENT:     Head: Normocephalic and atraumatic.  Eyes:     Conjunctiva/sclera: Conjunctivae normal.  Cardiovascular:     Rate and Rhythm: Normal rate and regular rhythm.     Heart sounds: No murmur heard.   Pulmonary:     Effort: Pulmonary effort is normal. No respiratory distress.     Breath sounds: Normal breath sounds. No decreased breath sounds, wheezing, rhonchi or rales.  Abdominal:     Palpations: Abdomen is soft.     Tenderness: There is no abdominal tenderness.  Musculoskeletal:     Cervical back: Neck supple.  Skin:    General: Skin is warm and dry.  Neurological:     Mental Status: She is alert.      UC Treatments / Results  Labs (all labs ordered are listed, but only abnormal results are displayed) Labs Reviewed  SARS CORONAVIRUS 2 (TAT 6-24 HRS)    EKG   Radiology No results found.  Procedures Procedures (including critical care time)  Medications Ordered in UC Medications - No data to display  Initial Impression / Assessment and Plan / UC Course  I have reviewed the triage vital signs and the nursing notes.  Pertinent labs & imaging results that were available during my care of the patient were reviewed by me and considered in my medical decision making (see chart for details).     Viral URI vs. COVID; COVID test pending.  Lungs clear to auscultation.  Tessalon pearls and albuterol inhaler prescribed today.  Pt can continue with Nyquil as needed.  Discussed with pt and daughter return precautions and isolation precautions.   Final Clinical Impressions(s) / UC Diagnoses   Final diagnoses:  None   Discharge Instructions   None    ED Prescriptions    None     PDMP not reviewed this encounter.   Jodell Cipro, PA-C 04/02/20  1911    Jodell Cipro, PA-C 04/02/20 1912

## 2020-04-02 NOTE — Patient Instructions (Addendum)
Medication Instructions:   *If you need a refill on your cardiac medications before your next appointment, please call your pharmacy*   Lab Work: Bmet for CT  If you have labs (blood work) drawn today and your tests are completely normal, you will receive your results only by: Marland Kitchen MyChart Message (if you have MyChart) OR . A paper copy in the mail If you have any lab test that is abnormal or we need to change your treatment, we will call you to review the results.   Testing/Procedures: Your cardiac CT will be scheduled at one of the below locations:   [facility] [address] [place], [place] [address] [phone]  If scheduled at [facility], please arrive at the [facility] main entrance of [facility] 30 minutes prior to test start time. Proceed to the [facility] Radiology Department (first floor) to check-in and test prep.  Please follow these instructions carefully (unless otherwise directed):   On the Night Before the Test: . Be sure to Drink plenty of water. . Do not consume any caffeinated/decaffeinated beverages or chocolate 12 hours prior to your test. . Do not take any antihistamines 12 hours prior to your test.  On the Day of the Test: . Drink plenty of water. Do not drink any water within one hour of the test. . Do not eat any food 4 hours prior to the test. . You may take your regular medications prior to the test.  . Take metoprolol (Lopressor) two hours prior to test. Will send in to your pharmacy. FEMALES- please wear underwire-free bra if available       After the Test: . Drink plenty of water. . After receiving IV contrast, you may experience a mild flushed feeling. This is normal. . On occasion, you may experience a mild rash up to 24 hours after the test. This is not dangerous. If this occurs, you can take Benadryl 25 mg and increase your fluid intake. . If you experience trouble breathing, this can be serious. If it is  severe call 911 IMMEDIATELY. If it is mild, please call our office. . If you take any of these medications: Glipizide/Metformin, Avandament, Glucavance, please do not take 48 hours after completing test unless otherwise instructed.   Once we have confirmed authorization from your insurance company, we will call you to set up a date and time for your test. Based on how quickly your insurance processes prior authorizations requests, please allow up to 4 weeks to be contacted for scheduling your Cardiac CT appointment. Be advised that routine Cardiac CT appointments could be scheduled as many as 8 weeks after your provider has ordered it.  For non-scheduling related questions, please contact the cardiac imaging nurse navigator should you have any questions/concerns: Rockwell Alexandria, Cardiac Imaging Nurse Navigator Mitzi Hansen, Interim Cardiac Imaging Nurse Navigator [facility] Direct Office Dial: [phone]   For scheduling needs, including cancellations and rescheduling, please call Sheralyn Boatman at [phone], option [phone].      Follow-Up: At [facility], you and your health needs are our priority.  As part of our continuing mission to provide you with exceptional heart care, we have created designated Provider Care Teams.  These Care Teams include your primary Cardiologist (physician) and Advanced Practice Providers (APPs -  Physician Assistants and Nurse Practitioners) who all work together to provide you with the care you need, when you need it.  We recommend signing up for the  patient portal called "MyChart".  Sign up information is provided on this After Visit Summary.  MyChart is used to connect with patients for Virtual Visits (Telemedicine).  Patients are able to view lab/test results, encounter notes, upcoming appointments, etc.  Non-urgent messages can be sent to your provider as well.   To learn more about what you can do with MyChart, go to [email or web address].     Your next appointment:   6 month(s)  The format for your next appointment:   In Person  Provider:   chandrasekhar   Other Instructions   Angiograma cardaco por tomografa computarizada (TC) Cardiac CT Angiogram El angiograma cardaco por TC es un procedimiento para estudiar el corazn y la zona que lo rodea. Podra realizarse para hallar la causa de dolores torcicos u otros sntomas de enfermedad cardaca. Durante este procedimiento, se inyecta una sustancia llamada tinte de contraste en los vasos sanguneos de la zona que se va a examinar. Un equipo de rayos X de gran tamao, llamado tomgrafo, captura imgenes detalladas del corazn y de la zona que lo rodea. El procedimiento tambin recibe otros nombres, como angiograma coronario por TC, exploracin de las arterias coronarias y Armed forces operational officer. Un angiograma cardaco por TC permite que el mdico vea cmo fluye la sangre desde y [place] corazn. El mdico podr ver si hay algn problema, como:  Obstruccin o estrechamiento de las arterias Conservation officer, nature.  Lquido alrededor del corazn.  Indicios de debilidad o enfermedad en los msculos, las vlvulas y los tejidos del corazn. Informe al mdico acerca de lo siguiente:  Cualquier alergia que tenga. Esto es importante, en especial, si ya tuvo alguna reaccin alrgica al tinte de [place].  Todos los [employer] [place], incluidos vitaminas, hierbas, gotas oftlmicas, cremas y medicamentos de venta libre.  Cualquier trastorno de la sangre que tenga.  Cirugas a las que se haya sometido.  Cualquier afeccin mdica que tenga.  Si est embarazada o podra estarlo.  Cualquier trastorno de ansiedad, Social research officer, government crnico u otras afecciones que tenga que pudieran aumentar su estrs o no permitir que permanezca recostado quieto. Cules son los riesgos? En general, se trata de un procedimiento seguro. Sin embargo, pueden ocurrir complicaciones, por  ejemplo:  Sangrado.  Infeccin.  Reacciones alrgicas a los medicamentos o a los tintes.  Daos a otras estructuras u rganos.  Dao renal debido al tinte de [place].  Aumento del riesgo de Chief Financial Officer por exposicin a la radiacin. Este riesgo es bajo. Converse con el mdico sobre lo siguiente: ? Los riesgos y [place]. ? Cmo puede recibir Scientist, research (life sciences) dosis posible de radiacin. Qu ocurre antes del procedimiento?  Use ropa cmoda y American Express, los anteojos, dentadura postiza y [employer].  Siga las instrucciones del mdico respecto de las comidas y las bebidas. Pueden incluir: ? English as a second language teacher consumo de cafena durante las 12horas previas al procedimiento. Esta se encuentra en el t, el caf, los refrescos, las bebidas energizantes y las pldoras para Horticulturist, commercial. Beba mucha agua u otros lquidos que no contengan cafena. Estar bien hidratado puede evitar complicaciones. ? Dejar de comer y beber entre 4 y 29 horas antes del procedimiento. El tinte de contraste puede provocarle nuseas, pero esto es menos probable si tiene Product manager vaco.  Consulte al mdico si debe cambiar o suspender los medicamentos que toma habitualmente. Esto es PepsiCo, en especial, si toma medicamentos para tratar la diabetes, anticoagulantes o medicamentos para tratar problemas de ereccin (disfuncin erctil).  Qu ocurre durante el procedimiento?   Es posible que deba afeitarse el vello del pecho para que en esa zona puedan colocarse pequeos parches Merrill Lynch llamados electrodos. Estos transmitirn informacin que ayuda a Patent examiner.  Le colocarn una va intravenosa (IV) en una de las venas.  Podran administrarle un medicamento para controlar la frecuencia cardaca durante el procedimiento. Esto ayudar a garantizar que puedan obtenerse buenas imgenes.  Le pedirn que se acueste en una camilla. Esta camilla se deslizar hacia  dentro y fuera del equipo de TC durante el procedimiento.  El tinte de [place] se Tour manager en la va intravenosa. Puede ser que sienta calor o un sabor metlico en la boca.  Le administrarn un medicamento llamado nitroglicerina. Este relajar o [place] arterias del corazn.  La camilla sobre la cual estar recostado se desplazar hacia el tnel del equipo de TC para que se realice la exploracin.  La persona a cargo del equipo le dar instrucciones mientras se lleve a cabo el estudio. Le pedirn que: ? Mantenga los brazos por encima de la cabeza. ? Contenga la respiracin. ? Se quede muy quieto, incluso si la camilla se mueve.  Cuando la exploracin haya finalizado, lo sacarn del equipo.  Se retirar el catter intravenoso. El procedimiento puede variar segn el mdico y el hospital. Qu puedo esperar despus del procedimiento? Despus del procedimiento, es comn Abbott Laboratories siguientes sntomas:  Un sabor metlico en la boca debido al tinte de [place].  Sensacin de calor.  Dolor de [place] a causa de Primary school teacher. Siga estas instrucciones en su casa:  Use los medicamentos de venta libre y los recetados solamente como se lo haya indicado el mdico.  Si se lo indican, beba suficiente lquido como para Theatre manager la orina de color amarillo plido. Esto ayudar a eliminar el tinte de contraste del cuerpo.  La State Farm de las personas pueden reanudar sus actividades normales inmediatamente despus del procedimiento. Pregntele al mdico qu actividades son seguras para usted.  Es su responsabilidad retirar Gap Inc del procedimiento. Pregntele a su mdico, o a un miembro del personal del departamento donde se realice el procedimiento, cundo estarn Praxair.  Concurra a todas las visitas de seguimiento como se lo haya indicado el mdico. Esto es importante. Comunquese con un mdico si:  Tiene sntomas de alergia al tinte de [place]. Esto incluye  lo siguiente: ? Falta de aire. ? Erupcin cutnea o ronchas. ? Latidos cardacos acelerados. Resumen  El angiograma cardaco por TC es un procedimiento para estudiar el corazn y la zona que lo rodea. Podra realizarse para hallar la causa de dolores torcicos u otros sntomas de enfermedad cardaca.  Durante este procedimiento, un equipo de rayosX de gran tamao, llamado tomgrafo, captura imgenes detalladas del corazn y de la zona circundante despus de haberse inyectado un tinte de contraste en los vasos sanguneos de la zona.  Consulte al mdico acerca de cambiar o suspender los medicamentos que toma habitualmente, antes del procedimiento. Esto es PepsiCo, en especial, si toma medicamentos para tratar la diabetes, anticoagulantes o medicamentos para tratar la disfuncin erctil.  Si se lo indican, beba suficiente lquido como para mantener la orina de color amarillo plido. Esto ayudar a eliminar el tinte de contraste del cuerpo. Esta informacin no tiene Marine scientist el consejo del mdico. Asegrese de hacerle al mdico cualquier pregunta que tenga. Document Revised: 05/04/2019 Document Reviewed: 05/04/2019 Elsevier Patient Education  [place].

## 2020-04-03 LAB — SARS CORONAVIRUS 2 (TAT 6-24 HRS): SARS Coronavirus 2: NEGATIVE

## 2020-04-13 ENCOUNTER — Ambulatory Visit: Payer: Medicare Other | Admitting: Cardiovascular Disease

## 2020-06-06 ENCOUNTER — Ambulatory Visit: Payer: Medicare Other | Admitting: Nurse Practitioner

## 2021-03-08 ENCOUNTER — Ambulatory Visit: Payer: Medicare Other | Admitting: Family

## 2024-04-04 NOTE — Therapy (Signed)
 OUTPATIENT PHYSICAL THERAPY THORACOLUMBAR EVALUATION   Patient Name: Sara Harper MRN: [id number] DOB:Aug 17, 1945, 78 y.o., female Today's Date: 04/05/2024  END OF SESSION:  PT End of Session - 04/05/24 1525     Visit Number 1    Date for PT Re-Evaluation 05/31/24    PT Start Time 1527    PT Stop Time 1610    PT Time Calculation (min) 43 min    Activity Tolerance Patient tolerated treatment well;No increased pain    Behavior During Therapy WFL for tasks assessed/performed          Past Medical History:  Diagnosis Date   Diabetes mellitus without complication (HCC)    High cholesterol    Hypertension    Past Surgical History:  Procedure Laterality Date   ABDOMINAL HYSTERECTOMY     CHOLECYSTECTOMY     Patient Active Problem List   Diagnosis Date Noted   Other hyperlipidemia 04/02/2020   Rotator cuff tear arthropathy, right 12/27/2019   Primary osteoarthritis of both hands 12/27/2019   Lumbar spondylosis 12/27/2019   Depression 05/03/2017   Type 2 diabetes mellitus without complication, without long-term current use of insulin (HCC) 04/22/2017   Hypertension associated with diabetes (HCC) 04/22/2017   Language barrier to communication 04/22/2017   Glucosuria 04/22/2017    PCP: Myrna Camelia HERO, NP   REFERRING PROVIDER: Ottie Chew, PA-C    REFERRING DIAG: M41.50 (ICD-10-CM) - Degenerative scoliosis  THERAPY DIAG:  Other low back pain  Muscle weakness (generalized)  Abnormal posture  Difficulty in walking, not elsewhere classified  RATIONALE FOR EVALUATION AND TREATMENT: Rehabilitation  ONSET DATE: chronic for many years  NEXT MD VISIT: unknown   SUBJECTIVE:                                                                                                                                                                                                         SUBJECTIVE STATEMENT: 78 y/o referred to PT for scoliosis.   She is newly arrived in the [place]  from [place].  She reports she had a long car ride to get here and that increased her pain. She states she tried to twist to the R while sitting in the car, and her pain got a lot worse.  She has had some injections 2 years ago that helped for a while but they wore off.   She was recommended for surgery in [place], but then they told her it was too dangerous.  She normally spends a year with her daughter here in the [place] , and then goes back  to [place] for a year.  She has had PT here in the past for the same back pain, and states that it helped.   However, the pain is worse this time.  She likes to walk for exercise, but hasn't been able to do so due to pain increases the more she walks.    PAIN: Are you having pain? Yes: NPRS scale: 4/10 average;  10/10 with bending Pain location: back and front of R thigh Pain description: aching most of the time, sharp with certain movement, prolonged gait Aggravating factors: walking, bending Relieving factors: nothing really per patient  PERTINENT HISTORY:  Diabetes, HTN, depression, R shoulder cuff tear arthropathy  PRECAUTIONS: None  RED FLAGS: None  WEIGHT BEARING RESTRICTIONS: No  FALLS:  Has patient fallen in last 6 months? No  LIVING ENVIRONMENT: Lives with: lives with their family and lives with their daughter Lives in: House/apartment Stairs: No Has following equipment at home: Single point cane  OCCUPATION: retired  PLOF: Independent with gait  PATIENT GOALS: not hurt   OBJECTIVE: (objective measures completed at initial evaluation unless otherwise dated)  DIAGNOSTIC FINDINGS:   MRI from 2022 EXAMINATION: MRI lumbar spine without contrast   CLINICAL INDICATION: Low back pain, right leg pain, fall with injury 2 years ago   TECHNIQUE: MRI lumbar spine protocol without contrast.   COMPARISON: None   FINDINGS:   There is a 28 degrees degenerative dextroscoliosis of the lumbar spine   Bone marrow signal: Normal   Conus  medullaris and cauda equina: Normal   L1-L2: There is moderate degenerative disc disease with a broad-based disc bulge. There is facet arthropathy with facet enlargement. There is no spinal stenosis or significant foraminal stenosis.   L2-L3: There is severe degeneration of the left side of the disc due to the scoliosis. There is a broad-based disc bulge. There is no significant facet disease. There is moderate stenosis of the left neural foramen and left lateral recess due to the disc bulge and scoliosis   L3-L4: There is severe degeneration of the left side of the disc due to the scoliosis and there is a broad-based disc bulge. There is facet arthropathy with facet enlargement. There is severe spinal stenosis with more severe stenosis of the left lateral recess. There is a severe left neuroforaminal stenosis   L4-L5: There is severe degeneration of the right side of the disc with a broad-based disc bulge. There is facet arthropathy on the right with facet enlargement. There is mild spinal stenosis with moderate stenosis of the right lateral recess. There is a severe right neuroforaminal stenosis.   L5-S1: There is moderate degenerative disc disease with a disc bulge that extends more laterally on the right. There is facet arthropathy on the right. There is a moderate right neuroforaminal stenosis    IMPRESSION:   Severe lumbar spondylosis with 20 degree degenerative dextroscoliosis.   There is severe spinal stenosis at L3-4 due to disc bulge and facet arthropathy, and a severe left neuroforaminal stenosis.   There is a severe right neuroforaminal stenosis at L4-5 due to scoliosis, degenerative disc disease and facet arthropathy.   Electronically Signed by: Nancyann Burns on 11/23/2020 10:06 AM  PATIENT SURVEYS:  ODI = 18 / 50 = 36.0 %  SCREENING FOR RED FLAGS: Bowel or bladder incontinence: No Spinal tumors: No Cauda equina syndrome: No Compression fracture: No Abdominal aneurysm:  No  COGNITION:  Overall cognitive status: Within functional limits for tasks assessed    SENSATION: [facility]  POSTURE:   dextroscoliosis  PALPATION: Not especially TTP over the low back, SI joints, quadratus, or piriformis  LUMBAR ROM:   Active  Eval  Flexion To knees; p!  Extension 75%; end range pain  Right lateral flexion To knee jt line  Left lateral flexion To mid thigh; p!  Right rotation 75%  Left rotation 50%; p!  (Blank rows = not tested)  MUSCLE LENGTH: Hamstrings: Right SLR 90 deg; Left SLR 80 deg with pain Thomas test: Right normal deg; Left NT deg Hamstrings: not especially tight ITB: WNL Piriformis: very minimal tightness Hip flexors:  none noted Quads: NT Heelcord: NT  LOWER EXTREMITY ROM:    MMT Right eval Left eval  Hip flexion    Hip extension    Hip abduction    Hip adduction    Hip internal rotation    Hip external rotation    Knee flexion    Knee extension    Ankle dorsiflexion    Ankle plantarflexion    Ankle inversion    Ankle eversion     (Blank rows = not tested)  LOWER EXTREMITY MMT:   Active  Right eval Left eval  Hip flexion 4- 4  Hip extension    Hip abduction 4- 4-  Hip adduction    Hip internal rotation 4- 4  Hip external rotation 4 4+  Knee flexion 4+ 4+  Knee extension 4+ 4+  Ankle dorsiflexion 4- 4-  Ankle plantarflexion    Ankle inversion    Ankle eversion    (Blank rows = not tested)  LUMBAR SPECIAL TESTS:  Straight leg raise test: Negative and Slump test: Negative;  FABER and Gaenslen's are generally painful to her low back, but difficult to tell if it is truly SI related;   SI compression and distraction are also generally painful but nonspecific to SI (language barrier despite the interpreter present is difficult to ascertain her true symptoms, she doesn't describe them well even to the interpreter)  FUNCTIONAL TESTS:  TBD  GAIT: Distance walked: into clinic x 200' Assistive device utilized: None Level of  assistance: Complete Independence Gait pattern: very slow gait speed, minimal trunk movement, short tentative steps, R scoliosis Comments:    TODAY'S TREATMENT:  04/05/24 SELF CARE: Provided education on PT POC progression and to promote safe home environment with bed grab bar to assist with bed transfers, initial HEP, importance of HEP compliance   PATIENT EDUCATION:  Education details: PT eval findings, anticipated POC, and initial HEP  Person educated: Patient and Child(ren) Education method: Explanation, Demonstration, Verbal cues, Tactile cues, and Handouts Education comprehension: verbalized understanding, verbal cues required, tactile cues required, and needs further education  HOME EXERCISE PROGRAM: Access Code: [email or web address] URL: [email or web address] Date: 04/05/2024 Prepared by: Garnette Montclair  Exercises - Supine Lower Trunk Rotation  - 1 x daily - 7 x weekly - 3 sets - 10 reps - Supine Bridge  - 1 x daily - 7 x weekly - 3 sets - 10 reps - Single Leg Bridge  - 1 x daily - 7 x weekly - 3 sets - 10 reps - Curl Up with Reach  - 1 x daily - 7 x weekly - 3 sets - 10 reps - Lateral Weight Shift with Arm Raise  - 1 x daily - 7 x weekly - 3 sets - 10 reps - Standing Hip Flexor Stretch With Overhead Reach and Contralateral Sidebend  - 1 x daily - 7 x weekly - 1 sets -  10 reps   ASSESSMENT:  CLINICAL IMPRESSION: Chaquetta Schlottman is a 78 y.o. female who was referred to physical therapy for evaluation and treatment for scoliosis and R LBP.    Patient reports onset of R low back pain worsening over the last 2 months after her long ride in a truck to get from [place] to visit her dtr.   However, she admits having LBP for years.  R scoliosis has been present for some time per her report.   She received injections in [place] that helped for a while, but have worn off.   She was advised to have back surgery in [place], but that it might be too dangerous so she did not opt for surgery.   MRI shows 20 degree degenerative scoliosis to R, disc bulges at all lumbar levels,  L3/4 severe spinal stenosis and L foraminal stenosis, L4/5, severe R foraminal stenosis.    Pain is worse with all activities per her report, but especially worse with standing lumbar flexion.  She wants to walk for exercise, but states the pain stops her.  She has a considerable dextroscoliosis.  Patient has deficits in lumbar ROM, some limitations of hip flexibility for piriformis only, BLE strength, abnormal posture, pain which are interfering with ADLs and are impacting quality of life.  On Modified Oswestry patient scored 18 / 50 = 36.0 %.  Kacelyn will benefit from skilled PT to address above deficits to improve mobility and activity tolerance with decreased pain interference.     OBJECTIVE IMPAIRMENTS: Abnormal gait, decreased ROM, decreased strength, postural dysfunction, and pain.   ACTIVITY LIMITATIONS: carrying, lifting, bending, sitting, standing, and locomotion level  PARTICIPATION LIMITATIONS: cleaning, laundry, shopping, and community activity  PERSONAL FACTORS: Age, Past/current experiences, Time since onset of injury/illness/exacerbation, and 1-2 comorbidities: diabetes, HTN, OA, depression are also affecting patient's functional outcome.   REHAB POTENTIAL: Good  CLINICAL DECISION MAKING: Evolving/moderate complexity  EVALUATION COMPLEXITY: Moderate   GOALS: Goals reviewed with patient? Yes  SHORT TERM GOALS: Target date: 05/03/2024  Patient will be independent with initial HEP to improve outcomes and carryover.  Baseline: 100% PT assist required for correct completion Goal status: INITIAL  2.  Patient will report 25% improvement in low back pain to improve QOL. Baseline: 10/10 worst Goal status: INITIAL  LONG TERM GOALS: Target date: 06/01/2024   Patient will be independent with ongoing/advanced HEP for self-management at home.  Baseline: no advanced HEP yet Goal status:  INITIAL  2.  Patient will report 50-75% improvement in low back pain to improve QOL.  Baseline: 10/10 worst Goal status: INITIAL  3.  Patient to demonstrate ability to achieve and maintain good spinal alignment/posturing and body mechanics needed for daily activities. Baseline: severe R scoliosis Goal status: INITIAL  4.  Patient will demonstrate full pain free lumbar ROM to perform ADLs.   Baseline: Refer to above lumbar ROM table Goal status: INITIAL  5.  Patient will demonstrate improved BLE strength to >/= 5/5 for improved stability and ease of mobility. Baseline: Refer to above LE MMT table Goal status: INITIAL  6. Patient will report </= 24% on Modified Oswestry (MCID = 12%) to demonstrate improved functional ability with decreased pain interference. Baseline: 18 / 50 = 36.0 % Goal status: INITIAL  7.  Patient will tolerate and report ability to do 30 min of (standing/sitting/walking) w/o increased pain to allow for  improved mobility and activity tolerance. Baseline: pain with ambulation over 5-10 min Goal status: INITIAL   PLAN:  PT FREQUENCY: 1-2x/week  PT DURATION: 8 weeks  PLANNED INTERVENTIONS: 97164- PT Re-evaluation, 97750- Physical Performance Testing, 97110-Therapeutic exercises, 97530- Therapeutic activity, [id number]- Neuromuscular re-education, 97535- Self Care, [id number]- Manual therapy, [phone]- Gait training, [phone]- Electrical stimulation (unattended), 97016- Vasopneumatic device, [id number]- Ultrasound, [id number]- Traction (mechanical), [id number]- Ionotophoresis 4mg /ml Dexamethasone, [id number] (1-2 muscles), [id number] (3+ muscles)- Dry Needling, Patient/Family education, Balance training, Stair training, Taping, Joint mobilization, Spinal mobilization, Cryotherapy, and Moist heat  PLAN FOR NEXT SESSION: Gently progress lumbar ROM and strength, modalities PRN   Naida Escalante, PT 04/05/2024, 5:30 PM

## 2024-04-05 ENCOUNTER — Other Ambulatory Visit: Payer: Self-pay

## 2024-04-05 ENCOUNTER — Encounter: Payer: Self-pay | Admitting: Rehabilitation

## 2024-04-05 ENCOUNTER — Ambulatory Visit: Attending: Physician Assistant | Admitting: Rehabilitation

## 2024-04-05 DIAGNOSIS — M5459 Other low back pain: Secondary | ICD-10-CM | POA: Insufficient documentation

## 2024-04-05 DIAGNOSIS — M6281 Muscle weakness (generalized): Secondary | ICD-10-CM | POA: Diagnosis present

## 2024-04-05 DIAGNOSIS — R293 Abnormal posture: Secondary | ICD-10-CM | POA: Insufficient documentation

## 2024-04-05 DIAGNOSIS — R262 Difficulty in walking, not elsewhere classified: Secondary | ICD-10-CM | POA: Insufficient documentation

## 2024-04-12 ENCOUNTER — Ambulatory Visit

## 2024-04-19 ENCOUNTER — Ambulatory Visit

## 2024-04-19 DIAGNOSIS — R293 Abnormal posture: Secondary | ICD-10-CM

## 2024-04-19 DIAGNOSIS — M5459 Other low back pain: Secondary | ICD-10-CM

## 2024-04-19 DIAGNOSIS — R262 Difficulty in walking, not elsewhere classified: Secondary | ICD-10-CM

## 2024-04-19 DIAGNOSIS — M6281 Muscle weakness (generalized): Secondary | ICD-10-CM

## 2024-04-19 NOTE — Therapy (Addendum)
 OUTPATIENT PHYSICAL THERAPY THORACOLUMBAR TREATMENT / DC SUMMARY   Patient Name: Sara Harper MRN: [id number] DOB:[birth year], 78 y.o., female Today's Date: 04/19/2024  END OF SESSION:  PT End of Session - 04/19/24 1502     Visit Number 2    Date for Recertification  05/31/24    PT Start Time 1459    PT Stop Time 1540    PT Time Calculation (min) 41 min    Activity Tolerance Patient tolerated treatment well;No increased pain    Behavior During Therapy WFL for tasks assessed/performed           Past Medical History:  Diagnosis Date   Diabetes mellitus without complication (HCC)    High cholesterol    Hypertension    Past Surgical History:  Procedure Laterality Date   ABDOMINAL HYSTERECTOMY     CHOLECYSTECTOMY     Patient Active Problem List   Diagnosis Date Noted   Other hyperlipidemia 04/02/2020   Rotator cuff tear arthropathy, right 12/27/2019   Primary osteoarthritis of both hands 12/27/2019   Lumbar spondylosis 12/27/2019   Depression 05/03/2017   Type 2 diabetes mellitus without complication, without long-term current use of insulin (HCC) 04/22/2017   Hypertension associated with diabetes (HCC) 04/22/2017   Language barrier to communication 04/22/2017   Glucosuria 04/22/2017    PCP: Myrna Camelia HERO, NP   REFERRING PROVIDER: Ottie Chew, PA-C    REFERRING DIAG: M41.50 (ICD-10-CM) - Degenerative scoliosis  THERAPY DIAG:  Other low back pain  Muscle weakness (generalized)  Abnormal posture  Difficulty in walking, not elsewhere classified  RATIONALE FOR EVALUATION AND TREATMENT: Rehabilitation  ONSET DATE: chronic for many years  NEXT MD VISIT: unknown   SUBJECTIVE:                                                                                                                                                                                                         SUBJECTIVE STATEMENT: Pt denies any lumbar pain, notes some R knee pain when  bending her knee.    PAIN: Are you having pain? Yes: NPRS scale: 4/10 average;  10/10 with bending Pain location: back and front of R thigh Pain description: aching most of the time, sharp with certain movement, prolonged gait Aggravating factors: walking, bending Relieving factors: nothing really per patient  PERTINENT HISTORY:  Diabetes, HTN, depression, R shoulder cuff tear arthropathy  PRECAUTIONS: None  RED FLAGS: None  WEIGHT BEARING RESTRICTIONS: No  FALLS:  Has patient fallen in last 6 months? No  LIVING ENVIRONMENT: Lives with: lives with their  family and lives with their daughter Lives in: House/apartment Stairs: No Has following equipment at home: Single point cane  OCCUPATION: retired  PLOF: Independent with gait  PATIENT GOALS: not hurt   OBJECTIVE: (objective measures completed at initial evaluation unless otherwise dated)  DIAGNOSTIC FINDINGS:   MRI from 2022 EXAMINATION: MRI lumbar spine without contrast   CLINICAL INDICATION: Low back pain, right leg pain, fall with injury 2 years ago   TECHNIQUE: MRI lumbar spine protocol without contrast.   COMPARISON: None   FINDINGS:   There is a 28 degrees degenerative dextroscoliosis of the lumbar spine   Bone marrow signal: Normal   Conus medullaris and cauda equina: Normal   L1-L2: There is moderate degenerative disc disease with a broad-based disc bulge. There is facet arthropathy with facet enlargement. There is no spinal stenosis or significant foraminal stenosis.   L2-L3: There is severe degeneration of the left side of the disc due to the scoliosis. There is a broad-based disc bulge. There is no significant facet disease. There is moderate stenosis of the left neural foramen and left lateral recess due to the disc bulge and scoliosis   L3-L4: There is severe degeneration of the left side of the disc due to the scoliosis and there is a broad-based disc bulge. There is facet arthropathy with facet  enlargement. There is severe spinal stenosis with more severe stenosis of the left lateral recess. There is a severe left neuroforaminal stenosis   L4-L5: There is severe degeneration of the right side of the disc with a broad-based disc bulge. There is facet arthropathy on the right with facet enlargement. There is mild spinal stenosis with moderate stenosis of the right lateral recess. There is a severe right neuroforaminal stenosis.   L5-S1: There is moderate degenerative disc disease with a disc bulge that extends more laterally on the right. There is facet arthropathy on the right. There is a moderate right neuroforaminal stenosis    IMPRESSION:   Severe lumbar spondylosis with 20 degree degenerative dextroscoliosis.   There is severe spinal stenosis at L3-4 due to disc bulge and facet arthropathy, and a severe left neuroforaminal stenosis.   There is a severe right neuroforaminal stenosis at L4-5 due to scoliosis, degenerative disc disease and facet arthropathy.   Electronically Signed by: Nancyann Burns on 11/23/2020 10:06 AM  PATIENT SURVEYS:  ODI = 18 / 50 = 36.0 %  SCREENING FOR RED FLAGS: Bowel or bladder incontinence: No Spinal tumors: No Cauda equina syndrome: No Compression fracture: No Abdominal aneurysm: No  COGNITION:  Overall cognitive status: Within functional limits for tasks assessed    SENSATION: WFL  POSTURE:   dextroscoliosis  PALPATION: Not especially TTP over the low back, SI joints, quadratus, or piriformis  LUMBAR ROM:   Active  Eval 04/19/24  Flexion To knees; p! To lower legs  Extension 75%; end range pain 60%  Right lateral flexion To knee jt line To knee jt line- pain both sides  Left lateral flexion To mid thigh; p! To mid thigh- pain both sides  Right rotation 75%   Left rotation 50%; p!   (Blank rows = not tested)  MUSCLE LENGTH: Hamstrings: Right SLR 90 deg; Left SLR 80 deg with pain Thomas test: Right normal deg; Left NT  deg Hamstrings: not especially tight ITB: WNL Piriformis: very minimal tightness Hip flexors:  none noted Quads: NT Heelcord: NT  LOWER EXTREMITY ROM:    MMT Right eval Left eval  Hip flexion  Hip extension    Hip abduction    Hip adduction    Hip internal rotation    Hip external rotation    Knee flexion    Knee extension    Ankle dorsiflexion    Ankle plantarflexion    Ankle inversion    Ankle eversion     (Blank rows = not tested)  LOWER EXTREMITY MMT:   Active  Right eval Left eval  Hip flexion 4- 4  Hip extension    Hip abduction 4- 4-  Hip adduction    Hip internal rotation 4- 4  Hip external rotation 4 4+  Knee flexion 4+ 4+  Knee extension 4+ 4+  Ankle dorsiflexion 4- 4-  Ankle plantarflexion    Ankle inversion    Ankle eversion    (Blank rows = not tested)  LUMBAR SPECIAL TESTS:  Straight leg raise test: Negative and Slump test: Negative;  FABER and Gaenslen's are generally painful to her low back, but difficult to tell if it is truly SI related;   SI compression and distraction are also generally painful but nonspecific to SI (language barrier despite the interpreter present is difficult to ascertain her true symptoms, she doesn't describe them well even to the interpreter)  FUNCTIONAL TESTS:  TBD  GAIT: Distance walked: into clinic x 200' Assistive device utilized: None Level of assistance: Complete Independence Gait pattern: very slow gait speed, minimal trunk movement, short tentative steps, R scoliosis Comments:    TODAY'S TREATMENT:  04/19/24 Nustep L2x35min Standing sidebends x 10R side Standing step and weight shift x 10  LTR both ways Bridges 2x10 Curl ups x 10- fatigued Supine march + TrA 2x10 Seated 3 way green pball stretch x 10 Seated rows RTB x 12 Seated shld extension RTB x 12  04/05/24 SELF CARE: Provided education on PT POC progression and to promote safe home environment with bed grab bar to assist with bed transfers,  initial HEP, importance of HEP compliance   PATIENT EDUCATION:  Education details: PT eval findings, anticipated POC, and initial HEP  Person educated: Patient and Child(ren) Education method: Explanation, Demonstration, Verbal cues, Tactile cues, and Handouts Education comprehension: verbalized understanding, verbal cues required, tactile cues required, and needs further education  HOME EXERCISE PROGRAM: Access Code: [email or web address] URL: [email or web address] Date: 04/19/2024 Prepared by: Elizabeht Suto  Exercises - Supine Lower Trunk Rotation  - 1 x daily - 7 x weekly - 3 sets - 10 reps - Supine Bridge  - 1 x daily - 7 x weekly - 3 sets - 10 reps - Single Leg Bridge  - 1 x daily - 7 x weekly - 3 sets - 10 reps - Curl Up with Reach  - 1 x daily - 7 x weekly - 3 sets - 10 reps - Lateral Weight Shift with Arm Raise  - 1 x daily - 7 x weekly - 3 sets - 10 reps - Standing Hip Flexor Stretch With Overhead Reach and Contralateral Sidebend  - 1 x daily - 7 x weekly - 1 sets - 10 reps - Seated Shoulder Row with Anchored Resistance  - 1 x daily - 7 x weekly - 3 sets - 10 reps - Seated Shoulder Extension and Scapular Retraction with Resistance  - 1 x daily - 7 x weekly - 3 sets - 10 reps   ASSESSMENT:  CLINICAL IMPRESSION: Pt has improved as far as pain and lumbar flexion and extension. She notes 25% improvement in overall pain. Reviewed HEP with  verb and tactile cues required with exercises and throughout session. Some complaints of R side LBP with the sidebend stretch.   Eval:Kearra Peedin is a 78 y.o. female who was referred to physical therapy for evaluation and treatment for scoliosis and R LBP.    Patient reports onset of R low back pain worsening over the last 2 months after her long ride in a truck to get from [place] to visit her dtr.   However, she admits having LBP for years.  R scoliosis has been present for some time per her report.   She received injections in [place] that  helped for a while, but have worn off.   She was advised to have back surgery in [place], but that it might be too dangerous so she did not opt for surgery.  MRI shows 20 degree degenerative scoliosis to R, disc bulges at all lumbar levels,  L3/4 severe spinal stenosis and L foraminal stenosis, L4/5, severe R foraminal stenosis.    Pain is worse with all activities per her report, but especially worse with standing lumbar flexion.  She wants to walk for exercise, but states the pain stops her.  She has a considerable dextroscoliosis.  Patient has deficits in lumbar ROM, some limitations of hip flexibility for piriformis only, BLE strength, abnormal posture, pain which are interfering with ADLs and are impacting quality of life.  On Modified Oswestry patient scored 18 / 50 = 36.0 %.  [place]ina will benefit from skilled PT to address above deficits to improve mobility and activity tolerance with decreased pain interference.     OBJECTIVE IMPAIRMENTS: Abnormal gait, decreased ROM, decreased strength, postural dysfunction, and pain.   ACTIVITY LIMITATIONS: carrying, lifting, bending, sitting, standing, and locomotion level  PARTICIPATION LIMITATIONS: cleaning, laundry, shopping, and community activity  PERSONAL FACTORS: Age, Past/current experiences, Time since onset of injury/illness/exacerbation, and 1-2 comorbidities: diabetes, HTN, OA, depression are also affecting patient's functional outcome.   REHAB POTENTIAL: Good  CLINICAL DECISION MAKING: Evolving/moderate complexity  EVALUATION COMPLEXITY: Moderate   GOALS: Goals reviewed with patient? Yes  SHORT TERM GOALS: Target date: 05/03/2024  Patient will be independent with initial HEP to improve outcomes and carryover.  Baseline: 100% PT assist required for correct completion Goal status: INITIAL  2.  Patient will report 25% improvement in low back pain to improve QOL. Baseline: 10/10 worst Goal status: MET- 04/19/24  LONG TERM GOALS: Target  date: 06/01/2024   Patient will be independent with ongoing/advanced HEP for self-management at home.  Baseline: no advanced HEP yet Goal status: INITIAL  2.  Patient will report 50-75% improvement in low back pain to improve QOL.  Baseline: 10/10 worst Goal status: INITIAL  3.  Patient to demonstrate ability to achieve and maintain good spinal alignment/posturing and body mechanics needed for daily activities. Baseline: severe R scoliosis Goal status: INITIAL  4.  Patient will demonstrate full pain free lumbar ROM to perform ADLs.   Baseline: Refer to above lumbar ROM table Goal status: IN PROGRESS- 04/19/24  5.  Patient will demonstrate improved BLE strength to >/= 5/5 for improved stability and ease of mobility. Baseline: Refer to above LE MMT table Goal status: INITIAL  6. Patient will report </= 24% on Modified Oswestry (MCID = 12%) to demonstrate improved functional ability with decreased pain interference. Baseline: 18 / 50 = 36.0 % Goal status: INITIAL  7.  Patient will tolerate and report ability to do 30 min of (standing/sitting/walking) w/o increased pain to allow for  improved  mobility and activity tolerance. Baseline: pain with ambulation over 5-10 min Goal status: INITIAL   PLAN:  PT FREQUENCY: 1-2x/week  PT DURATION: 8 weeks  PLANNED INTERVENTIONS: 97164- PT Re-evaluation, 97750- Physical Performance Testing, 97110-Therapeutic exercises, 97530- Therapeutic activity, [id number]- Neuromuscular re-education, 97535- Self Care, [id number]- Manual therapy, [phone]- Gait training, [phone]- Electrical stimulation (unattended), 97016- Vasopneumatic device, [id number]- Ultrasound, [id number]- Traction (mechanical), [id number]- Ionotophoresis 4mg /ml Dexamethasone, 79439 (1-2 muscles), 20561 (3+ muscles)- Dry Needling, Patient/Family education, Balance training, Stair training, Taping, Joint mobilization, Spinal mobilization, Cryotherapy, and Moist heat  PLAN FOR NEXT SESSION: Gently progress lumbar ROM  and strength, modalities PRN   Skip Litke L Letica Giaimo, PTA 04/19/2024, 3:53 PM   PHYSICAL THERAPY DISCHARGE SUMMARY  Visits from Start of Care: 2  Current functional level related to goals / functional outcomes: UNCHANGED.  Patient called to cancel all future appointments stating she was going to [place]  with family and didn't know when she would return   Remaining deficits: No change since initial visit   Education / Equipment: Initial HEP was provided   Patient agrees to discharge. Patient goals were not met. Patient is being discharged due to the patient's request.  Garnette Montclair, PT 06/10/2024, 10:56 AM  [facility] [address]  [address] [place], [place], [address] Phone: [phone]   Fax:  [phone]

## 2024-04-21 ENCOUNTER — Encounter: Admitting: Rehabilitation

## 2024-04-26 ENCOUNTER — Encounter

## 2024-04-28 ENCOUNTER — Encounter: Admitting: Rehabilitation

## 2024-05-03 ENCOUNTER — Encounter

## 2024-05-05 ENCOUNTER — Encounter

## 2024-05-10 ENCOUNTER — Encounter: Admitting: Rehabilitation
